# Patient Record
Sex: Female | Born: 1955 | Race: Black or African American | Hispanic: No | Marital: Married | Smoking: Never smoker
Health system: Southern US, Community
[De-identification: ages and names within clinical notes are randomized; demographics above are authoritative.]

## PROBLEM LIST (undated history)

## (undated) DIAGNOSIS — J45909 Unspecified asthma, uncomplicated: Secondary | ICD-10-CM

## (undated) DIAGNOSIS — J329 Chronic sinusitis, unspecified: Secondary | ICD-10-CM

## (undated) DIAGNOSIS — M722 Plantar fascial fibromatosis: Secondary | ICD-10-CM

## (undated) DIAGNOSIS — F419 Anxiety disorder, unspecified: Secondary | ICD-10-CM

## (undated) DIAGNOSIS — R0600 Dyspnea, unspecified: Secondary | ICD-10-CM

## (undated) DIAGNOSIS — I10 Essential (primary) hypertension: Secondary | ICD-10-CM

## (undated) DIAGNOSIS — M5416 Radiculopathy, lumbar region: Secondary | ICD-10-CM

## (undated) HISTORY — DX: Chronic sinusitis, unspecified: J32.9

## (undated) HISTORY — DX: Anxiety disorder, unspecified: F41.9

## (undated) HISTORY — DX: Dyspnea, unspecified: R06.00

## (undated) HISTORY — DX: Radiculopathy, lumbar region: M54.16

## (undated) HISTORY — DX: Plantar fascial fibromatosis: M72.2

---

## 1998-09-28 ENCOUNTER — Other Ambulatory Visit: Admission: RE | Admit: 1998-09-28 | Discharge: 1998-09-28 | Payer: Self-pay | Admitting: Obstetrics and Gynecology

## 1999-11-09 ENCOUNTER — Other Ambulatory Visit: Admission: RE | Admit: 1999-11-09 | Discharge: 1999-11-09 | Payer: Self-pay | Admitting: Obstetrics and Gynecology

## 2012-05-14 SURGERY — LIGATION, FALLOPIAN TUBE, POSTPARTUM
Anesthesia: Spinal | Laterality: Bilateral

## 2014-03-04 ENCOUNTER — Encounter (HOSPITAL_COMMUNITY): Payer: Self-pay | Admitting: *Deleted

## 2014-03-04 ENCOUNTER — Emergency Department (HOSPITAL_COMMUNITY)
Admission: EM | Admit: 2014-03-04 | Discharge: 2014-03-05 | Disposition: A | Discharge status: Home / Self Care | Payer: BC Managed Care – PPO | Attending: Emergency Medicine | Admitting: Emergency Medicine

## 2014-03-04 DIAGNOSIS — M5441 Lumbago with sciatica, right side: Secondary | ICD-10-CM | POA: Diagnosis not present

## 2014-03-04 DIAGNOSIS — I10 Essential (primary) hypertension: Secondary | ICD-10-CM | POA: Insufficient documentation

## 2014-03-04 DIAGNOSIS — M545 Low back pain: Secondary | ICD-10-CM | POA: Diagnosis present

## 2014-03-04 DIAGNOSIS — J45909 Unspecified asthma, uncomplicated: Secondary | ICD-10-CM | POA: Insufficient documentation

## 2014-03-04 HISTORY — DX: Unspecified asthma, uncomplicated: J45.909

## 2014-03-04 HISTORY — DX: Essential (primary) hypertension: I10

## 2014-03-04 NOTE — ED Notes (Signed)
Pt reports R back pain radiating down to her R leg.  Pt reports hx of sciatica.

## 2014-03-05 MED ORDER — HYDROMORPHONE HCL 1 MG/ML IJ SOLN
1.0000 mg | Freq: Once | INTRAMUSCULAR | Status: AC
  Administered 2014-03-05: 1 mg via INTRAMUSCULAR
  Filled 2014-03-05: qty 1

## 2014-03-05 MED ORDER — PREDNISONE 10 MG PO TABS: ORAL_TABLET | ORAL | Status: DC

## 2014-03-05 MED ORDER — CYCLOBENZAPRINE HCL 10 MG PO TABS: 10.0000 mg | ORAL_TABLET | Freq: Two times a day (BID) | ORAL | Status: AC | PRN

## 2014-03-05 MED ORDER — HYDROCODONE-ACETAMINOPHEN 5-325 MG PO TABS: 1.0000 | ORAL_TABLET | ORAL | Status: AC | PRN

## 2014-03-05 NOTE — ED Provider Notes (Signed)
CSN: [id number]     Arrival date & time 03/04/14  [id number] History   None    Chief Complaint  Patient presents with  . Back Pain     (Consider location/radiation/quality/duration/timing/severity/associated sxs/prior Treatment) Patient is a 58 y.o. female presenting with back pain. The history is provided by the patient. No language interpreter was used.  Back Pain Location:  Lumbar spine Quality:  Stabbing and shooting Radiates to:  R knee and R thigh Pain severity:  Moderate Associated symptoms: no fever, no numbness and no weakness   Associated symptoms comment:  Recurrent low back pain that started 2 days ago. Previously treated for sciatica and pain is currently radiating into the right thigh as it has in the past. No weakness or numbness. No incontinence issues.    Past Medical History  Diagnosis Date  . Hypertension   . Asthma    History reviewed. No pertinent past surgical history. No family history on file. History  Substance Use Topics  . Smoking status: Never Smoker   . Smokeless tobacco: Not on file  . Alcohol Use: No   OB History    No data available     Review of Systems  Constitutional: Negative for fever and chills.  Gastrointestinal: Negative.  Negative for nausea.  Genitourinary: Negative for enuresis.  Musculoskeletal: Positive for back pain.  Skin: Negative.   Neurological: Negative.  Negative for weakness and numbness.      Allergies  Review of patient's allergies indicates no known allergies.  Home Medications   Prior to Admission medications   Not on File   BP 194/78 mmHg  Pulse 70  Temp(Src) 97.6 F (36.4 C) (Oral)  Resp 16  SpO2 100% Physical Exam  Constitutional: She is oriented to person, place, and time. She appears well-developed and well-nourished.  HENT:  Head: Normocephalic.  Neck: Normal range of motion. Neck supple.  Cardiovascular: Normal rate and regular rhythm.   Pulmonary/Chest: Effort normal and breath sounds  normal.  Abdominal: Soft. Bowel sounds are normal. There is no tenderness. There is no rebound and no guarding.  Musculoskeletal: Normal range of motion.  Mild lumbar and paralumbar tenderness without swelling or discoloration.  Neurological: She is alert and oriented to person, place, and time. She has normal reflexes. Coordination normal.  Ambulatory without ataxia.   Skin: Skin is warm and dry. No rash noted.  Psychiatric: She has a normal mood and affect.    ED Course  Procedures (including critical care time) Labs Review Labs Reviewed - No data to display  Imaging Review No results found.   EKG Interpretation None      MDM   Final diagnoses:  None    1. Low back pain  Pain is recurrent. There are no neurologic deficits on exam. Pain relief offered along with ortho referral to fully evaluation recurrent pattern.    Arnoldo HookerShari A Marita Burnsed, PA-C 03/05/14 [phone]  Derwood KaplanAnkit Nanavati, MD 03/06/14 [phone]

## 2014-03-05 NOTE — Discharge Instructions (Signed)
Back Pain, Adult °Back pain is very common. The pain often gets better over time. The cause of back pain is usually not dangerous. Most people can learn to manage their back pain on their own.  °HOME CARE  °· Stay active. Start with short walks on flat ground if you can. Try to walk farther each day. °· Do not sit, drive, or stand in one place for more than 30 minutes. Do not stay in bed. °· Do not avoid exercise or work. Activity can help your back heal faster. °· Be careful when you bend or lift an object. Bend at your knees, keep the object close to you, and do not twist. °· Sleep on a firm mattress. Lie on your side, and bend your knees. If you lie on your back, put a pillow under your knees. °· Only take medicines as told by your doctor. °· Put ice on the injured area. °¨ Put ice in a plastic bag. °¨ Place a towel between your skin and the bag. °¨ Leave the ice on for 15-20 minutes, 03-04 times a day for the first 2 to 3 days. After that, you can switch between ice and heat packs. °· Ask your doctor about back exercises or massage. °· Avoid feeling anxious or stressed. Find good ways to deal with stress, such as exercise. °GET HELP RIGHT AWAY IF:  °· Your pain does not go away with rest or medicine. °· Your pain does not go away in 1 week. °· You have new problems. °· You do not feel well. °· The pain spreads into your legs. °· You cannot control when you poop (bowel movement) or pee (urinate). °· Your arms or legs feel weak or lose feeling (numbness). °· You feel sick to your stomach (nauseous) or throw up (vomit). °· You have belly (abdominal) pain. °· You feel like you may pass out (faint). °MAKE SURE YOU:  °· Understand these instructions. °· Will watch your condition. °· Will get help right away if you are not doing well or get worse. °Document Released: 08/21/2007 Document Revised: 05/27/2011 Document Reviewed: 07/06/2013 °[employer]® Patient Information ©2015 [employer]. This information is not intended  to replace advice given to you by your health care provider. Make sure you discuss any questions you have with your health care provider. ° °

## 2014-07-19 ENCOUNTER — Ambulatory Visit (INDEPENDENT_AMBULATORY_CARE_PROVIDER_SITE_OTHER): Payer: BC Managed Care – PPO | Admitting: Podiatry

## 2014-07-19 ENCOUNTER — Encounter: Payer: Self-pay | Admitting: Podiatry

## 2014-07-19 ENCOUNTER — Ambulatory Visit (INDEPENDENT_AMBULATORY_CARE_PROVIDER_SITE_OTHER): Payer: BC Managed Care – PPO

## 2014-07-19 VITALS — BP 141/70 | HR 64 | Resp 12

## 2014-07-19 DIAGNOSIS — M722 Plantar fascial fibromatosis: Secondary | ICD-10-CM

## 2014-07-19 DIAGNOSIS — M2041 Other hammer toe(s) (acquired), right foot: Secondary | ICD-10-CM

## 2014-07-19 DIAGNOSIS — M779 Enthesopathy, unspecified: Secondary | ICD-10-CM

## 2014-07-19 MED ORDER — TRIAMCINOLONE ACETONIDE 10 MG/ML IJ SUSP
10.0000 mg | Freq: Once | INTRAMUSCULAR | Status: AC
  Administered 2014-07-19: 10 mg

## 2014-07-19 NOTE — Patient Instructions (Signed)

## 2014-07-19 NOTE — Progress Notes (Signed)
   Subjective:    Patient ID: Autumn Adams, female    DOB: 02/26/1956, 59 y.o.   MRN: [id number]  HPI  PT STATED RT FOOT BOTTOM AND SIDE OF THE HEEL IS BEEN PAINFUL FOR 3 WEEKS. THE HEEL IS GETTING WORSE, ESPECIALLY WHEN WALKING OR FIRST STEP IN THE MORNING. TRIED ICE IT HELP SOME.  Review of Systems  Respiratory: Positive for shortness of breath.   Musculoskeletal: Positive for joint swelling and gait problem.       Objective:   Physical Exam        Assessment & Plan:

## 2014-07-19 NOTE — Progress Notes (Signed)
Subjective:     Patient ID: Autumn Adams, female   DOB: 10/23/1955, 59 y.o.   MRN: [id number]  HPI patient presents stating I'm having a lot of pain in my right heel around my right first metatarsal joint and I know I need to get this toe fixed but I was supposed to get fixed 2 years ago. Also my orthotics have lost their support and no longer affective for me   Review of Systems     Objective:   Physical Exam Neurovascular status is found to be intact with muscle strength adequate range of motion within normal limits. Patient's plantar heel right is quite sore when palpated with moderate depression of the arch and discomfort around the first MPJ right consistent with possible gout. It is inflamed with structural bunion deformity noted and I noted there to be painful keratotic lesion fifth toe right foot    Assessment:     Plantar fasciitis right with depression of the arch is, getting factor with capsulitis first MPJ right with possible gout-like symptoms and hammertoe deformity of long-term duration right fifth toe    Plan:     Reviewed all conditions and discussed treatment options. Mother focus on the heel and the capsule first and long-term we are to fix this toe which she wants to get fixed and approximate 1 month area I injected the right plantar fascia 3 Milligan Kenalog 5 g Xylocaine and dispensed fascial brace and injected around the first MPJ right 3 mg Kenalog 5 mg Xylocaine and discussed the hammertoe repair fifth toe right foot. Also she will bring orthotics in the next visit and we will evaluate for probable do orthotics and she is scheduled back in 1 week to see me

## 2014-07-26 ENCOUNTER — Ambulatory Visit: Payer: BC Managed Care – PPO | Admitting: Podiatry

## 2014-07-28 ENCOUNTER — Ambulatory Visit (INDEPENDENT_AMBULATORY_CARE_PROVIDER_SITE_OTHER): Payer: BC Managed Care – PPO | Admitting: Podiatry

## 2014-07-28 DIAGNOSIS — M722 Plantar fascial fibromatosis: Secondary | ICD-10-CM

## 2014-07-31 NOTE — Progress Notes (Signed)
Subjective:     Patient ID: Autumn Adams, female   DOB: 03/11/1956, 59 y.o.   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

## 2014-08-26 ENCOUNTER — Ambulatory Visit: Payer: BC Managed Care – PPO | Admitting: *Deleted

## 2014-08-26 DIAGNOSIS — M722 Plantar fascial fibromatosis: Secondary | ICD-10-CM

## 2014-08-26 NOTE — Patient Instructions (Signed)

## 2014-08-26 NOTE — Progress Notes (Signed)
Patient ID: Autumn Adams, female   DOB: [birth year], 59 y.o.   MRN: [id number] PICKING UP INSERTS. PLANTAR FASCIITIS IS FLARING.

## 2014-09-26 ENCOUNTER — Ambulatory Visit: Payer: BC Managed Care – PPO | Admitting: Podiatry

## 2015-08-10 DIAGNOSIS — H903 Sensorineural hearing loss, bilateral: Secondary | ICD-10-CM | POA: Insufficient documentation

## 2015-08-10 DIAGNOSIS — H6993 Unspecified Eustachian tube disorder, bilateral: Secondary | ICD-10-CM | POA: Insufficient documentation

## 2015-10-20 ENCOUNTER — Other Ambulatory Visit: Payer: Self-pay | Admitting: Otolaryngology

## 2015-10-20 DIAGNOSIS — H9192 Unspecified hearing loss, left ear: Secondary | ICD-10-CM

## 2015-11-02 ENCOUNTER — Other Ambulatory Visit: Payer: BC Managed Care – PPO

## 2017-06-23 ENCOUNTER — Ambulatory Visit
Admission: RE | Admit: 2017-06-23 | Discharge: 2017-06-23 | Disposition: A | Discharge status: Home / Self Care | Payer: BC Managed Care – PPO | Source: Ambulatory Visit | Attending: Internal Medicine | Admitting: Internal Medicine

## 2017-06-23 ENCOUNTER — Other Ambulatory Visit: Payer: Self-pay | Admitting: Internal Medicine

## 2017-06-23 DIAGNOSIS — M5416 Radiculopathy, lumbar region: Secondary | ICD-10-CM

## 2017-07-01 ENCOUNTER — Other Ambulatory Visit: Payer: Self-pay | Admitting: Internal Medicine

## 2017-07-01 DIAGNOSIS — M5416 Radiculopathy, lumbar region: Secondary | ICD-10-CM

## 2017-07-05 ENCOUNTER — Ambulatory Visit: Payer: BC Managed Care – PPO

## 2018-11-25 ENCOUNTER — Ambulatory Visit
Admission: RE | Admit: 2018-11-25 | Discharge: 2018-11-25 | Disposition: A | Discharge status: Home / Self Care | Payer: BC Managed Care – PPO | Source: Ambulatory Visit | Attending: Internal Medicine | Admitting: Internal Medicine

## 2018-11-25 ENCOUNTER — Other Ambulatory Visit: Payer: Self-pay | Admitting: Internal Medicine

## 2018-11-25 DIAGNOSIS — M5489 Other dorsalgia: Secondary | ICD-10-CM

## 2018-12-18 ENCOUNTER — Other Ambulatory Visit: Payer: Self-pay | Admitting: Internal Medicine

## 2018-12-18 DIAGNOSIS — M5416 Radiculopathy, lumbar region: Secondary | ICD-10-CM

## 2019-01-03 ENCOUNTER — Ambulatory Visit
Admission: RE | Admit: 2019-01-03 | Discharge: 2019-01-03 | Disposition: A | Discharge status: Home / Self Care | Payer: BC Managed Care – PPO | Source: Ambulatory Visit | Attending: Internal Medicine | Admitting: Internal Medicine

## 2019-01-03 ENCOUNTER — Other Ambulatory Visit: Payer: Self-pay

## 2019-01-03 DIAGNOSIS — M5416 Radiculopathy, lumbar region: Secondary | ICD-10-CM

## 2019-01-15 DIAGNOSIS — G8929 Other chronic pain: Secondary | ICD-10-CM | POA: Insufficient documentation

## 2019-10-21 DIAGNOSIS — M5431 Sciatica, right side: Secondary | ICD-10-CM | POA: Diagnosis not present

## 2019-11-10 DIAGNOSIS — Z1211 Encounter for screening for malignant neoplasm of colon: Secondary | ICD-10-CM | POA: Diagnosis not present

## 2019-11-10 DIAGNOSIS — K5730 Diverticulosis of large intestine without perforation or abscess without bleeding: Secondary | ICD-10-CM | POA: Diagnosis not present

## 2019-11-10 DIAGNOSIS — K648 Other hemorrhoids: Secondary | ICD-10-CM | POA: Diagnosis not present

## 2019-11-10 DIAGNOSIS — Q438 Other specified congenital malformations of intestine: Secondary | ICD-10-CM | POA: Diagnosis not present

## 2019-11-12 DIAGNOSIS — L03011 Cellulitis of right finger: Secondary | ICD-10-CM | POA: Diagnosis not present

## 2019-12-12 DIAGNOSIS — Z20828 Contact with and (suspected) exposure to other viral communicable diseases: Secondary | ICD-10-CM | POA: Diagnosis not present

## 2019-12-16 DIAGNOSIS — E7800 Pure hypercholesterolemia, unspecified: Secondary | ICD-10-CM | POA: Diagnosis not present

## 2019-12-16 DIAGNOSIS — M109 Gout, unspecified: Secondary | ICD-10-CM | POA: Diagnosis not present

## 2019-12-16 DIAGNOSIS — L900 Lichen sclerosus et atrophicus: Secondary | ICD-10-CM | POA: Diagnosis not present

## 2019-12-16 DIAGNOSIS — M5431 Sciatica, right side: Secondary | ICD-10-CM | POA: Diagnosis not present

## 2019-12-16 DIAGNOSIS — J309 Allergic rhinitis, unspecified: Secondary | ICD-10-CM | POA: Diagnosis not present

## 2019-12-16 DIAGNOSIS — E559 Vitamin D deficiency, unspecified: Secondary | ICD-10-CM | POA: Diagnosis not present

## 2019-12-16 DIAGNOSIS — I10 Essential (primary) hypertension: Secondary | ICD-10-CM | POA: Diagnosis not present

## 2019-12-16 DIAGNOSIS — Z23 Encounter for immunization: Secondary | ICD-10-CM | POA: Diagnosis not present

## 2019-12-16 DIAGNOSIS — H269 Unspecified cataract: Secondary | ICD-10-CM | POA: Diagnosis not present

## 2019-12-16 DIAGNOSIS — K5730 Diverticulosis of large intestine without perforation or abscess without bleeding: Secondary | ICD-10-CM | POA: Diagnosis not present

## 2019-12-16 DIAGNOSIS — Z1389 Encounter for screening for other disorder: Secondary | ICD-10-CM | POA: Diagnosis not present

## 2019-12-16 DIAGNOSIS — Z0000 Encounter for general adult medical examination without abnormal findings: Secondary | ICD-10-CM | POA: Diagnosis not present

## 2020-01-17 DIAGNOSIS — R202 Paresthesia of skin: Secondary | ICD-10-CM | POA: Diagnosis not present

## 2020-01-17 DIAGNOSIS — I10 Essential (primary) hypertension: Secondary | ICD-10-CM | POA: Diagnosis not present

## 2020-01-19 DIAGNOSIS — H04123 Dry eye syndrome of bilateral lacrimal glands: Secondary | ICD-10-CM | POA: Diagnosis not present

## 2020-01-19 DIAGNOSIS — H43812 Vitreous degeneration, left eye: Secondary | ICD-10-CM | POA: Diagnosis not present

## 2020-01-19 DIAGNOSIS — H25013 Cortical age-related cataract, bilateral: Secondary | ICD-10-CM | POA: Diagnosis not present

## 2020-02-15 DIAGNOSIS — Z1231 Encounter for screening mammogram for malignant neoplasm of breast: Secondary | ICD-10-CM | POA: Diagnosis not present

## 2020-02-28 DIAGNOSIS — L900 Lichen sclerosus et atrophicus: Secondary | ICD-10-CM | POA: Diagnosis not present

## 2020-03-13 DIAGNOSIS — L439 Lichen planus, unspecified: Secondary | ICD-10-CM | POA: Diagnosis not present

## 2020-04-10 DIAGNOSIS — L089 Local infection of the skin and subcutaneous tissue, unspecified: Secondary | ICD-10-CM | POA: Diagnosis not present

## 2020-04-10 DIAGNOSIS — D485 Neoplasm of uncertain behavior of skin: Secondary | ICD-10-CM | POA: Diagnosis not present

## 2020-04-10 DIAGNOSIS — L988 Other specified disorders of the skin and subcutaneous tissue: Secondary | ICD-10-CM | POA: Diagnosis not present

## 2020-05-11 DIAGNOSIS — L309 Dermatitis, unspecified: Secondary | ICD-10-CM | POA: Diagnosis not present

## 2020-06-07 DIAGNOSIS — H25013 Cortical age-related cataract, bilateral: Secondary | ICD-10-CM | POA: Diagnosis not present

## 2020-06-07 DIAGNOSIS — H04123 Dry eye syndrome of bilateral lacrimal glands: Secondary | ICD-10-CM | POA: Diagnosis not present

## 2020-06-07 DIAGNOSIS — H43813 Vitreous degeneration, bilateral: Secondary | ICD-10-CM | POA: Diagnosis not present

## 2020-07-10 DIAGNOSIS — L439 Lichen planus, unspecified: Secondary | ICD-10-CM | POA: Diagnosis not present

## 2020-07-10 DIAGNOSIS — L309 Dermatitis, unspecified: Secondary | ICD-10-CM | POA: Diagnosis not present

## 2020-07-13 DIAGNOSIS — H268 Other specified cataract: Secondary | ICD-10-CM | POA: Diagnosis not present

## 2020-07-13 DIAGNOSIS — H25011 Cortical age-related cataract, right eye: Secondary | ICD-10-CM | POA: Diagnosis not present

## 2020-09-21 DIAGNOSIS — L309 Dermatitis, unspecified: Secondary | ICD-10-CM | POA: Diagnosis not present

## 2020-09-21 DIAGNOSIS — L439 Lichen planus, unspecified: Secondary | ICD-10-CM | POA: Diagnosis not present

## 2020-11-03 DIAGNOSIS — L989 Disorder of the skin and subcutaneous tissue, unspecified: Secondary | ICD-10-CM | POA: Diagnosis not present

## 2020-11-03 DIAGNOSIS — L309 Dermatitis, unspecified: Secondary | ICD-10-CM | POA: Diagnosis not present

## 2020-11-03 DIAGNOSIS — L308 Other specified dermatitis: Secondary | ICD-10-CM | POA: Diagnosis not present

## 2020-11-03 DIAGNOSIS — L439 Lichen planus, unspecified: Secondary | ICD-10-CM | POA: Diagnosis not present

## 2020-11-07 DIAGNOSIS — M5441 Lumbago with sciatica, right side: Secondary | ICD-10-CM | POA: Diagnosis not present

## 2020-11-07 DIAGNOSIS — I10 Essential (primary) hypertension: Secondary | ICD-10-CM | POA: Diagnosis not present

## 2020-11-07 DIAGNOSIS — Z6837 Body mass index (BMI) 37.0-37.9, adult: Secondary | ICD-10-CM | POA: Diagnosis not present

## 2020-11-17 DIAGNOSIS — M5416 Radiculopathy, lumbar region: Secondary | ICD-10-CM | POA: Diagnosis not present

## 2020-11-17 DIAGNOSIS — R1031 Right lower quadrant pain: Secondary | ICD-10-CM | POA: Diagnosis not present

## 2020-12-26 ENCOUNTER — Ambulatory Visit
Admission: EM | Admit: 2020-12-26 | Discharge: 2020-12-26 | Disposition: A | Discharge status: Home / Self Care | Payer: Medicare PPO

## 2020-12-26 DIAGNOSIS — T148XXA Other injury of unspecified body region, initial encounter: Secondary | ICD-10-CM

## 2020-12-26 MED ORDER — METHYLPREDNISOLONE SODIUM SUCC 125 MG IJ SOLR
80.0000 mg | Freq: Once | INTRAMUSCULAR | Status: AC
  Administered 2020-12-26: 80 mg via INTRAMUSCULAR

## 2020-12-26 NOTE — ED Provider Notes (Signed)
[facility]    CSN: [id number] Arrival date & time: 12/26/20  1619      History   Chief Complaint Chief Complaint  Patient presents with   Flank Pain    left    HPI Autumn Adams is a 65 y.o. female.   Patient here today for evaluation of left-sided flank pain that started last night.  She reports she had difficulty sleeping because any movement would cause worsening pain.  She has had similar symptoms in her back but not in this area.  She has not had any numbness or tingling.  She has tried taking her medications at home including Robaxin without significant relief.  She denies any chest pain or shortness of breath.  She denied any lightheadedness.  The history is provided by the patient.  Flank Pain Pertinent negatives include no chest pain and no shortness of breath.   Past Medical History:  Diagnosis Date   Asthma    Hypertension     There are no problems to display for this patient.   History reviewed. No pertinent surgical history.  OB History   No obstetric history on file.      Home Medications    Prior to Admission medications   Medication Sig Start Date End Date Taking? Authorizing Provider  allopurinol (ZYLOPRIM) 100 MG tablet Take 100 mg by mouth daily. 05/31/14   [provider]  amLODipine-benazepril (LOTREL) 10-20 MG per capsule Take 1 capsule by mouth daily. 02/26/14   [provider]  amoxicillin-clavulanate (AUGMENTIN) 500-125 MG per tablet  07/05/14   [provider]  cloNIDine (CATAPRES) 0.1 MG tablet Take 0.1 mg by mouth at bedtime. 10/06/20   [provider]  cyclobenzaprine (FLEXERIL) 10 MG tablet Take 1 tablet (10 mg total) by mouth 2 (two) times daily as needed for muscle spasms. 03/05/14   Elpidio Anis, PA-C  HYDROcodone-acetaminophen (NORCO/VICODIN) 5-325 MG per tablet Take 1-2 tablets by mouth every 4 (four) hours as needed. 03/05/14   Elpidio Anis, PA-C  indomethacin (INDOCIN) 25  MG capsule  07/04/14   [provider]  methocarbamol (ROBAXIN) 500 MG tablet Take 500 mg by mouth every 6 (six) hours as needed. 11/07/20   [provider]  metoprolol succinate (TOPROL-XL) 50 MG 24 hr tablet Take 50 mg by mouth daily. 01/26/14   [provider]  metoprolol tartrate (LOPRESSOR) 25 MG tablet Take 25 mg by mouth 2 (two) times daily. 11/14/20   [provider]  naproxen sodium (ANAPROX) 220 MG tablet Take 440 mg by mouth 2 (two) times daily as needed (pain).    [provider]  predniSONE (DELTASONE) 10 MG tablet Take 6 tablets day 1 Take 5 tablets day 2 Take 4 tablets day 3 Take 3 tablets day 4  Take 2 tablets day 5 Take 1 tablet day 6 03/05/14   Upstill, Melvenia Beam, PA-C  predniSONE (STERAPRED UNI-PAK) 5 MG TABS tablet  05/31/14   [provider]  probenecid (BENEMID) 500 MG tablet Take 250 mg by mouth 2 (two) times daily. 02/26/14   [provider]  traMADol (ULTRAM) 50 MG tablet Take 50-100 mg by mouth every 6 (six) hours as needed. 11/07/20   [provider]  VENTOLIN HFA 108 (90 BASE) MCG/ACT inhaler Inhale 2 puffs into the lungs every 4 (four) hours as needed for wheezing or shortness of breath.  01/19/14   [provider]    Family History History reviewed. No pertinent family history.  Social History Social History   Tobacco Use   Smoking status: Never   Smokeless tobacco: Never  Substance Use Topics   Alcohol use: No   Drug use: No     Allergies   Patient has no known allergies.   Review of Systems Review of Systems  Constitutional:  Negative for chills and fever.  Eyes:  Negative for discharge and redness.  Respiratory:  Negative for cough and shortness of breath.   Cardiovascular:  Negative for chest pain.  Gastrointestinal:  Negative for nausea and vomiting.  Genitourinary:  Positive for flank pain.  Musculoskeletal:  Positive for back pain.    Physical Exam Triage Vital  Signs ED Triage Vitals  Enc Vitals Group     BP 12/26/20 1648 (!) 178/103     Pulse Rate 12/26/20 1648 (!) 101     Resp 12/26/20 1648 18     Temp 12/26/20 1648 98.6 F (37 C)     Temp Source 12/26/20 1648 Oral     SpO2 12/26/20 1648 95 %     Weight --      Height --      Head Circumference --      Peak Flow --      Pain Score 12/26/20 1651 9     Pain Loc --      Pain Edu? --      Excl. in GC? --    No data found.  Updated Vital Signs BP (!) 178/103 (BP Location: Left Arm) Comment: Pt took 2/3 BP meds today  Pulse (!) 101   Temp 98.6 F (37 C) (Oral)   Resp 18   SpO2 95%      Physical Exam Vitals and nursing note reviewed.  Constitutional:      General: She is not in acute distress.    Appearance: Normal appearance. She is not ill-appearing.  HENT:     Head: Normocephalic and atraumatic.  Eyes:     Conjunctiva/sclera: Conjunctivae normal.  Cardiovascular:     Rate and Rhythm: Normal rate.  Pulmonary:     Effort: Pulmonary effort is normal.  Musculoskeletal:     Comments: TTP noted around left flank with muscle spasm appreciated  Skin:    General: Skin is warm and dry.  Neurological:     Mental Status: She is alert.  Psychiatric:        Mood and Affect: Mood normal.        Behavior: Behavior normal.     UC Treatments / Results  Labs (all labs ordered are listed, but only abnormal results are displayed) Labs Reviewed - No data to display  EKG   Radiology No results found.  Procedures Procedures (including critical care time)  Medications Ordered in UC Medications  methylPREDNISolone sodium succinate (SOLU-MEDROL) 125 mg/2 mL injection 80 mg (80 mg Intramuscular Given 12/26/20 1711)    Initial Impression / Assessment and Plan / UC Course  I have reviewed the triage vital signs and the nursing notes.  Pertinent labs & imaging results that were available during my care of the patient were reviewed by me and considered in my medical decision  making (see chart for details).  Suspect likely muscular strain, and offered steroid treatment for same.  Patient prefers injection as she has had this in the past and it has helped.  Solu-Medrol injection administered in office.  Recommended follow-up if symptoms fail to improve or worsen anyway.  Final Clinical Impressions(s) / UC Diagnoses  Final diagnoses:  Muscle strain     Discharge Instructions      Take muscle relaxer if needed for spasms. Follow up with any further concerns.      ED Prescriptions   None    PDMP not reviewed this encounter.   Tomi Bamberger, PA-C 12/26/20 1722

## 2020-12-26 NOTE — ED Triage Notes (Signed)
Onset yesterday of left sided flank pain that has worsened since the onset. Pt describes the pain as a "catch". Notes h/o back pain. Pain is interfering is with her sleep. Has been taking tramadol and robaxin w/o relief. Denies BLE pain and n/t. Denies urinary sxs.

## 2020-12-26 NOTE — Discharge Instructions (Signed)
Take muscle relaxer if needed for spasms. Follow up with any further concerns.

## 2020-12-27 DIAGNOSIS — I10 Essential (primary) hypertension: Secondary | ICD-10-CM | POA: Diagnosis not present

## 2020-12-27 DIAGNOSIS — S335XXA Sprain of ligaments of lumbar spine, initial encounter: Secondary | ICD-10-CM | POA: Diagnosis not present

## 2020-12-28 DIAGNOSIS — Z7722 Contact with and (suspected) exposure to environmental tobacco smoke (acute) (chronic): Secondary | ICD-10-CM | POA: Diagnosis not present

## 2020-12-28 DIAGNOSIS — Z818 Family history of other mental and behavioral disorders: Secondary | ICD-10-CM | POA: Diagnosis not present

## 2020-12-28 DIAGNOSIS — I739 Peripheral vascular disease, unspecified: Secondary | ICD-10-CM | POA: Diagnosis not present

## 2020-12-28 DIAGNOSIS — E785 Hyperlipidemia, unspecified: Secondary | ICD-10-CM | POA: Diagnosis not present

## 2020-12-28 DIAGNOSIS — Z79891 Long term (current) use of opiate analgesic: Secondary | ICD-10-CM | POA: Diagnosis not present

## 2020-12-28 DIAGNOSIS — L309 Dermatitis, unspecified: Secondary | ICD-10-CM | POA: Diagnosis not present

## 2020-12-28 DIAGNOSIS — Z803 Family history of malignant neoplasm of breast: Secondary | ICD-10-CM | POA: Diagnosis not present

## 2020-12-28 DIAGNOSIS — Z6836 Body mass index (BMI) 36.0-36.9, adult: Secondary | ICD-10-CM | POA: Diagnosis not present

## 2020-12-28 DIAGNOSIS — G8929 Other chronic pain: Secondary | ICD-10-CM | POA: Diagnosis not present

## 2020-12-28 DIAGNOSIS — I10 Essential (primary) hypertension: Secondary | ICD-10-CM | POA: Diagnosis not present

## 2020-12-28 DIAGNOSIS — Z791 Long term (current) use of non-steroidal anti-inflammatories (NSAID): Secondary | ICD-10-CM | POA: Diagnosis not present

## 2020-12-28 DIAGNOSIS — K08409 Partial loss of teeth, unspecified cause, unspecified class: Secondary | ICD-10-CM | POA: Diagnosis not present

## 2020-12-28 DIAGNOSIS — M792 Neuralgia and neuritis, unspecified: Secondary | ICD-10-CM | POA: Diagnosis not present

## 2020-12-28 DIAGNOSIS — Z823 Family history of stroke: Secondary | ICD-10-CM | POA: Diagnosis not present

## 2020-12-28 DIAGNOSIS — J45909 Unspecified asthma, uncomplicated: Secondary | ICD-10-CM | POA: Diagnosis not present

## 2020-12-28 DIAGNOSIS — Z811 Family history of alcohol abuse and dependence: Secondary | ICD-10-CM | POA: Diagnosis not present

## 2020-12-28 DIAGNOSIS — F439 Reaction to severe stress, unspecified: Secondary | ICD-10-CM | POA: Diagnosis not present

## 2021-01-15 DIAGNOSIS — M4316 Spondylolisthesis, lumbar region: Secondary | ICD-10-CM | POA: Diagnosis not present

## 2021-01-15 DIAGNOSIS — I10 Essential (primary) hypertension: Secondary | ICD-10-CM | POA: Diagnosis not present

## 2021-01-15 DIAGNOSIS — Z6836 Body mass index (BMI) 36.0-36.9, adult: Secondary | ICD-10-CM | POA: Diagnosis not present

## 2021-01-15 DIAGNOSIS — M5416 Radiculopathy, lumbar region: Secondary | ICD-10-CM | POA: Diagnosis not present

## 2021-02-02 DIAGNOSIS — I10 Essential (primary) hypertension: Secondary | ICD-10-CM | POA: Diagnosis not present

## 2021-02-02 DIAGNOSIS — S335XXA Sprain of ligaments of lumbar spine, initial encounter: Secondary | ICD-10-CM | POA: Diagnosis not present

## 2021-02-20 DIAGNOSIS — Z1231 Encounter for screening mammogram for malignant neoplasm of breast: Secondary | ICD-10-CM | POA: Diagnosis not present

## 2021-02-21 DIAGNOSIS — I10 Essential (primary) hypertension: Secondary | ICD-10-CM | POA: Diagnosis not present

## 2021-02-21 DIAGNOSIS — M7582 Other shoulder lesions, left shoulder: Secondary | ICD-10-CM | POA: Diagnosis not present

## 2021-04-05 DIAGNOSIS — J309 Allergic rhinitis, unspecified: Secondary | ICD-10-CM | POA: Diagnosis not present

## 2021-04-05 DIAGNOSIS — J4520 Mild intermittent asthma, uncomplicated: Secondary | ICD-10-CM | POA: Diagnosis not present

## 2021-04-05 DIAGNOSIS — K5730 Diverticulosis of large intestine without perforation or abscess without bleeding: Secondary | ICD-10-CM | POA: Diagnosis not present

## 2021-04-05 DIAGNOSIS — L900 Lichen sclerosus et atrophicus: Secondary | ICD-10-CM | POA: Diagnosis not present

## 2021-04-05 DIAGNOSIS — Z1211 Encounter for screening for malignant neoplasm of colon: Secondary | ICD-10-CM | POA: Diagnosis not present

## 2021-04-05 DIAGNOSIS — I10 Essential (primary) hypertension: Secondary | ICD-10-CM | POA: Diagnosis not present

## 2021-04-05 DIAGNOSIS — R202 Paresthesia of skin: Secondary | ICD-10-CM | POA: Diagnosis not present

## 2021-04-05 DIAGNOSIS — E559 Vitamin D deficiency, unspecified: Secondary | ICD-10-CM | POA: Diagnosis not present

## 2021-04-05 DIAGNOSIS — Z1389 Encounter for screening for other disorder: Secondary | ICD-10-CM | POA: Diagnosis not present

## 2021-04-05 DIAGNOSIS — Z23 Encounter for immunization: Secondary | ICD-10-CM | POA: Diagnosis not present

## 2021-04-05 DIAGNOSIS — E7800 Pure hypercholesterolemia, unspecified: Secondary | ICD-10-CM | POA: Diagnosis not present

## 2021-04-05 DIAGNOSIS — M109 Gout, unspecified: Secondary | ICD-10-CM | POA: Diagnosis not present

## 2021-04-05 DIAGNOSIS — Z0000 Encounter for general adult medical examination without abnormal findings: Secondary | ICD-10-CM | POA: Diagnosis not present

## 2021-04-09 IMAGING — MR MR LUMBAR SPINE W/O CM
4 of 5 series · 23 of 48 positions shown · non-contrast
Comparison: Prior radiograph from 11/25/2018.

CLINICAL DATA: Initial evaluation for low back pain extending to
waist, pain in right hip and thigh.

EXAM:
MRI LUMBAR SPINE WITHOUT CONTRAST
TECHNIQUE: Multiplanar, multisequence MR imaging of the lumbar spine was
performed. No intravenous contrast was administered.

[Series 2: T2 · sagittal · 4.0mm · 0.55mm/px · 6 of 16 slices shown (1 of 2)]
[im 1/16]
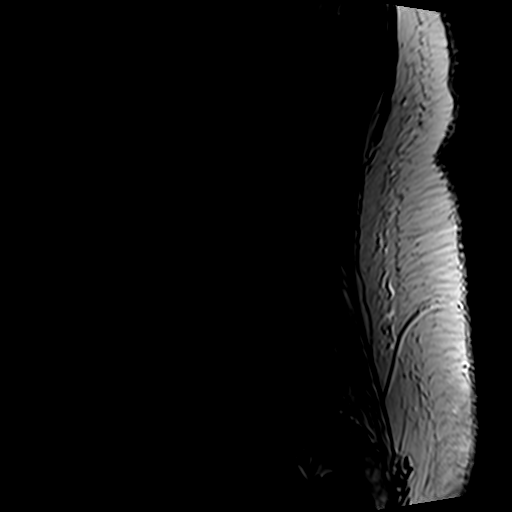
[im 4/16]
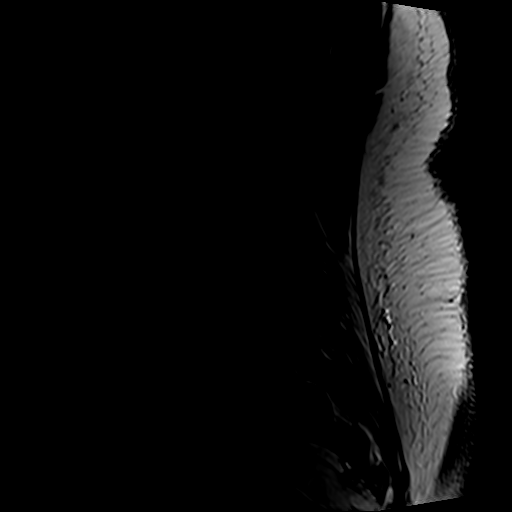
[im 7/16]
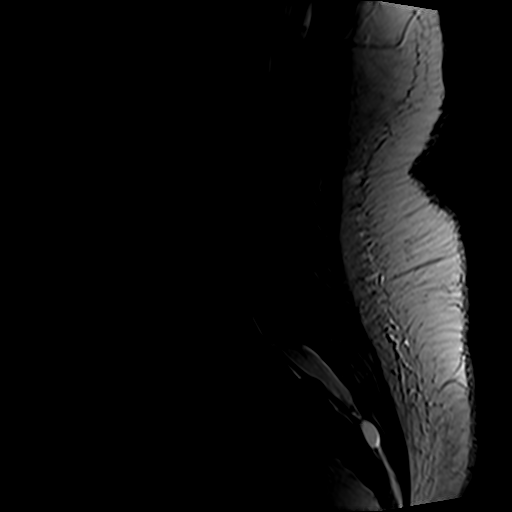
[im 10/16]
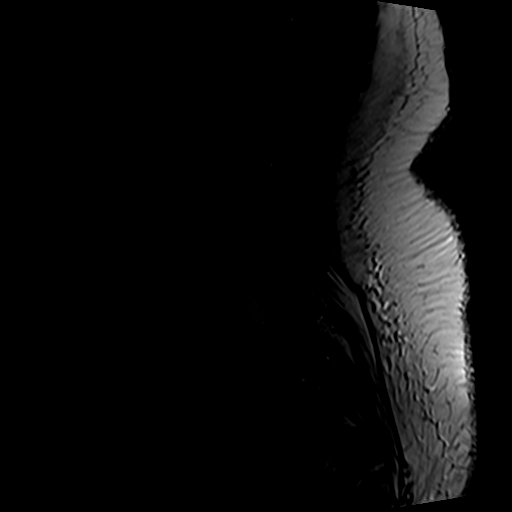
[im 13/16]
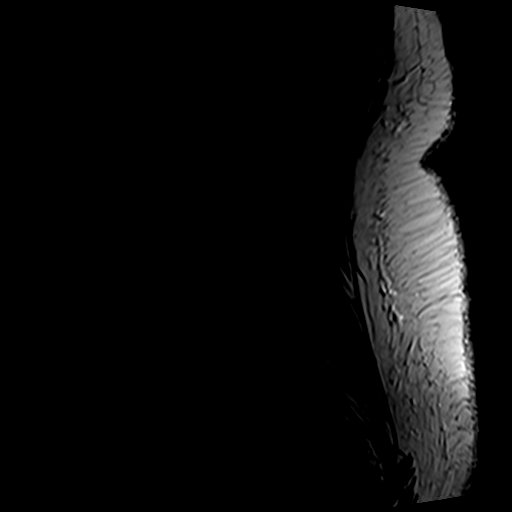
[im 16/16]
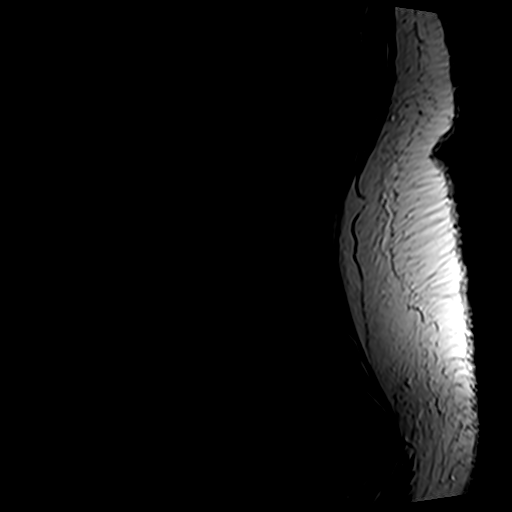

[Series 4: T1 · sagittal · 4.0mm · 0.55mm/px · 5 of 16 slices shown (1 of 2)]
[im 1/16]
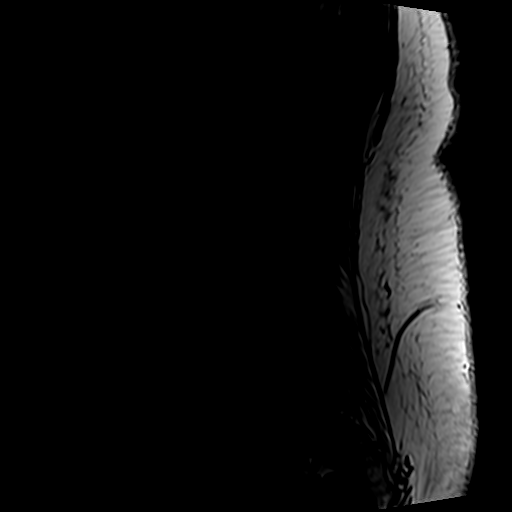
[im 4/16]
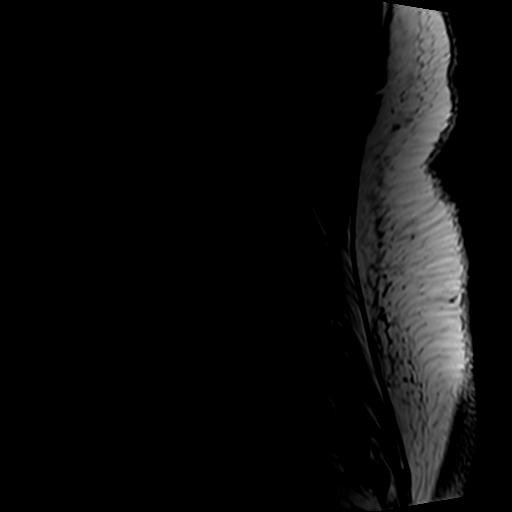
[im 7/16]
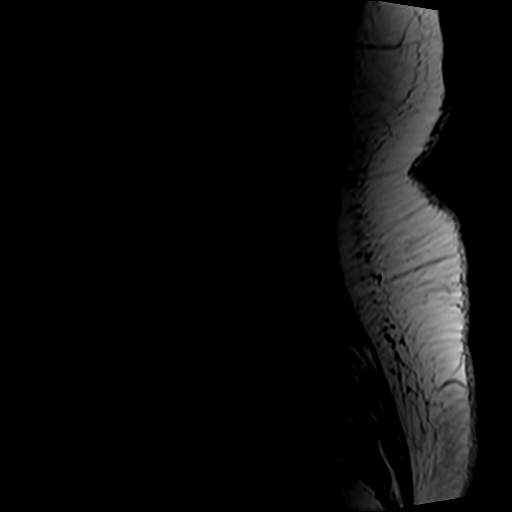
[im 10/16]
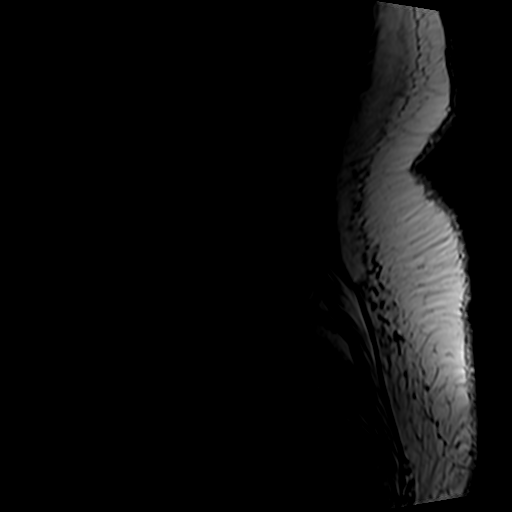
[im 16/16]
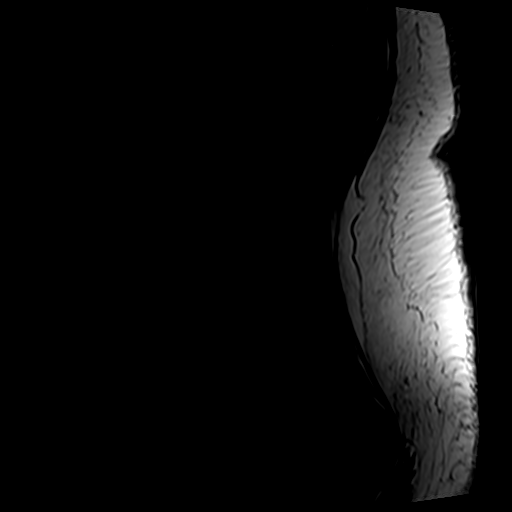

[Series 5: T2 · axial · 4.0mm · 0.70mm/px · z∈[-88,+119]mm · 9 of 37 slices shown (2 of 2)]
[im 1/37]
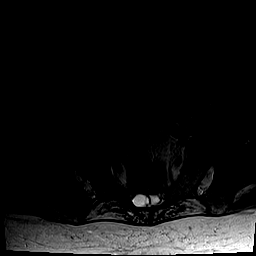
[im 6/37]
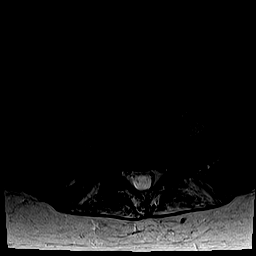
[im 11/37]
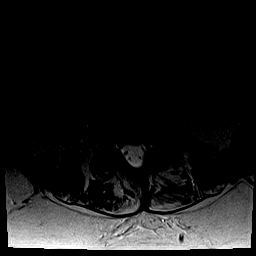
[im 16/37]
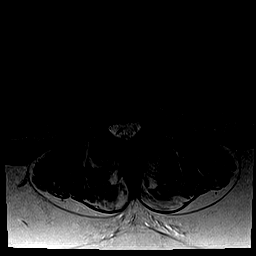
[im 19/37]
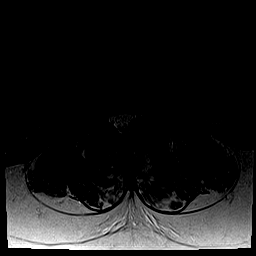
[im 21/37]
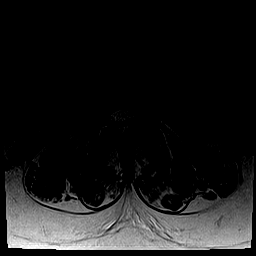
[im 26/37]
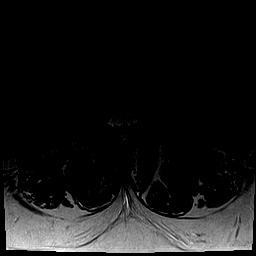
[im 31/37]
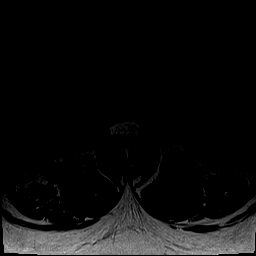
[im 37/37]
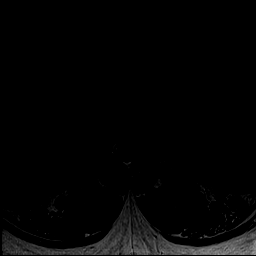

[Series 6: T1 · axial · 4.0mm · 0.35mm/px · z∈[-62,+88]mm · 3 of 37 slices shown (2 of 2)]
[im 6/37]
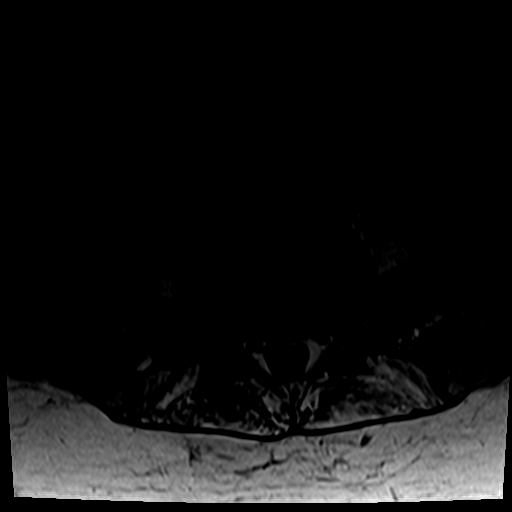
[im 19/37]
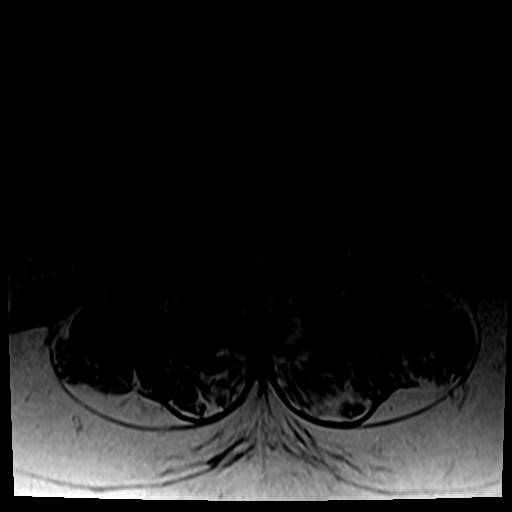
[im 31/37]
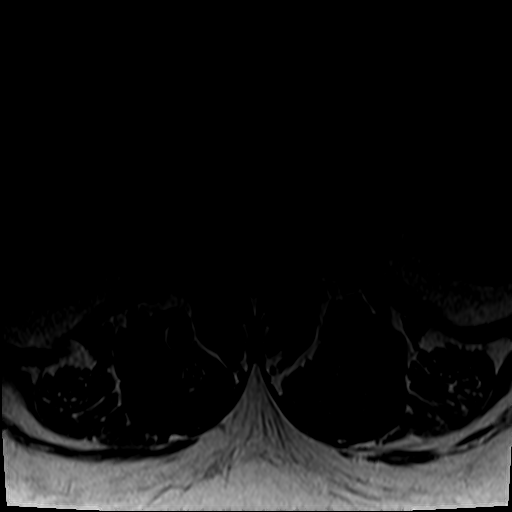

[23 of 48 positions shown; findings below may reference images not displayed]

FINDINGS: Segmentation: Standard. Lowest well-formed disc labeled the L5-S1
level.

Alignment: 3-4 mm anterolisthesis of L3 on L4, L4 on L5, and L5 on
S1, chronic and facet mediated. Mild exaggeration of the normal
lumbar lordosis.

Vertebrae: Vertebral body height maintained without evidence for
acute or chronic fracture. Bone marrow signal intensity within
normal limits. No discrete or worrisome osseous lesions. Reactive
endplate changes noted about the L5-S1 interspace. No other abnormal
marrow edema.

Conus medullaris and cauda equina: Conus extends to the L1 level.
Conus and cauda equina appear normal

Paraspinal and other soft tissues: Paraspinous soft tissues within
normal limits. Partially visualized visceral structures are
unremarkable.

Disc levels:

T10-11: Seen only on sagittal projection. Mild diffuse disc bulge
with disc desiccation. No significant stenosis.

T11-12: Unremarkable.

T12-L1: Mild diffuse disc bulge with disc desiccation. Superimposed
right subarticular disc extrusion with slight superior and inferior
migration of disc material (series 2, image 8). Slight lateral
extension towards the right neural foramen. Superimposed mild facet
hypertrophy. Resultant mild to moderate right lateral recess
stenosis, potentially affecting the right L1 nerve root. Mild to
moderate right foraminal narrowing. Central canal remains patent.

L1-2:  Unremarkable.

L2-3: Mild disc bulge with disc desiccation. Disc bulging asymmetric
to the right with superimposed right foraminal/extraforaminal disc
protrusion, closely approximating and potentially affecting the
exiting right L2 nerve root (series 5, image 16). Moderate facet and
ligament flavum hypertrophy. Trace joint effusion on the right. No
significant canal or lateral recess stenosis. Mild right worse than
left foraminal stenosis.

L3-4: Anterolisthesis. Mild diffuse disc bulge with disc desiccation
and intervertebral disc space narrowing. Moderate facet and ligament
flavum hypertrophy. Resultant mild left greater than right lateral
recess stenosis. Central canal remains patent. Mild to moderate
bilateral L3 foraminal narrowing.

L4-5: Anterolisthesis. Disc desiccation with mild annular disc
bulge. Moderate bilateral facet hypertrophy. Resultant mild left
lateral recess stenosis. No significant narrowing of the central
canal. Mild bilateral L4 foraminal stenosis.

L5-S1: Anterolisthesis. Diffuse disc bulge with disc desiccation and
intervertebral disc space narrowing. Mild reactive endplate changes.
Mild-to-moderate bilateral facet hypertrophy. No significant canal
or lateral recess stenosis. Moderate right L5 foraminal narrowing.
IMPRESSION: 1. Small right subarticular disc extrusion at T12-L1, potentially
affecting the descending right L1 nerve root.
2. Small right foraminal/extraforaminal disc protrusion at L2-3,
closely approximating and potentially affecting the exiting right L2
nerve root.
3. Moderate right L5 foraminal stenosis related to disc bulge,
reactive endplate changes, and facet hypertrophy.
4. Chronic grade 1 anterolisthesis of L3 on L4 through L5 on S1 with
associated moderate to advanced facet hypertrophy, which could
contribute to underlying back pain.

## 2021-06-25 ENCOUNTER — Ambulatory Visit (INDEPENDENT_AMBULATORY_CARE_PROVIDER_SITE_OTHER): Payer: Medicare HMO

## 2021-06-25 ENCOUNTER — Ambulatory Visit: Payer: Medicare HMO | Admitting: Podiatry

## 2021-06-25 DIAGNOSIS — L84 Corns and callosities: Secondary | ICD-10-CM

## 2021-06-25 DIAGNOSIS — M2041 Other hammer toe(s) (acquired), right foot: Secondary | ICD-10-CM | POA: Diagnosis not present

## 2021-06-25 DIAGNOSIS — M722 Plantar fascial fibromatosis: Secondary | ICD-10-CM | POA: Diagnosis not present

## 2021-06-25 MED ORDER — TRIAMCINOLONE ACETONIDE 10 MG/ML IJ SUSP: 10.0000 mg | Freq: Once | INTRAMUSCULAR | Status: AC

## 2021-06-25 MED ORDER — TRIAMCINOLONE ACETONIDE 10 MG/ML IJ SUSP
10.0000 mg | Freq: Once | INTRAMUSCULAR | Status: AC
  Administered 2021-06-25: 10 mg

## 2021-06-25 NOTE — Patient Instructions (Signed)

## 2021-06-26 NOTE — Progress Notes (Signed)
Subjective:  ? ?Patient ID: Autumn Adams, female   DOB: 66 y.o.   MRN: [id number]  ? ?HPI ?Patient presents with painful left heel its been going on for a while and states that she has dealt with it on and off but its been worse over the last 6 months and has a painful lesion right fifth toe that aches and causes her difficulty with wearing shoe gear.  Patient does not smoke likes to be active ? ? ?Review of Systems  ?All other systems reviewed and are negative. ? ? ?   ?Objective:  ?Physical Exam ?Vitals and nursing note reviewed.  ?Constitutional:   ?   Appearance: She is well-developed.  ?Pulmonary:  ?   Effort: Pulmonary effort is normal.  ?Musculoskeletal:     ?   General: Normal range of motion.  ?Skin: ?   General: Skin is warm.  ?Neurological:  ?   Mental Status: She is alert.  ?  ?Neurovascular status found to be intact muscle strength found to be adequate range of motion adequate.  Patient is noted to have acute discomfort plantar aspect left heel at the insertional point of the tendon into the calcaneus with inflammation fluid buildup and is noted to have keratotic lesion digit 5 right painful when pressed making shoe gear difficult.  Good digital perfusion well oriented x3 ? ?   ?Assessment:  ?Acute plantar fasciitis left with inflammation fluid buildup along with keratotic lesion digit 5 right painful when pressed ? ?   ?Plan:  ?H&P all conditions discussed and x-rays reviewed.  Today I went ahead and I did sterile prep injected the fascia left 3 mg Kenalog 5 mg Xylocaine at insertion and applied fascial brace to lift up the arch with instructions for shoe gear modifications oral anti-inflammatories.  Debrided lesion fifth digit right discussed arthroplasty that she wants done but I want to make sure the heel is better first ? ?X-rays indicate small spur plantar heel does not indicate stress fracture and rotation fifth digit right foot ?   ? ? ?

## 2021-07-27 DIAGNOSIS — M5416 Radiculopathy, lumbar region: Secondary | ICD-10-CM | POA: Diagnosis not present

## 2021-09-05 ENCOUNTER — Encounter: Payer: Self-pay | Admitting: Podiatry

## 2021-09-05 ENCOUNTER — Ambulatory Visit: Payer: Medicare HMO | Admitting: Podiatry

## 2021-09-05 DIAGNOSIS — M722 Plantar fascial fibromatosis: Secondary | ICD-10-CM | POA: Diagnosis not present

## 2021-09-05 MED ORDER — TRIAMCINOLONE ACETONIDE 10 MG/ML IJ SUSP
10.0000 mg | Freq: Once | INTRAMUSCULAR | Status: AC
  Administered 2021-09-05: 10 mg

## 2021-09-05 NOTE — Progress Notes (Signed)
Subjective:   Patient ID: Autumn Adams, female   DOB: 66 y.o.   MRN: [id number]   HPI Patient states that the heel pain left has come back and is now more constant but it is worse when she gets up in the morning and after periods of sitting   ROS      Objective:  Physical Exam  Neurovascular status intact exquisite discomfort plantar aspect left heel at the insertional point tendon calcaneus worse when she gets up in the morning after periods of sitting     Assessment:  Acute Planter fasciitis left that is reoccurring and related to periods of inactivity followed by weightbearing     Plan:  Recommended long-term night splint and this was dispensed and fitted properly to her foot with instructions on usage and also heat ice therapy which was instructed and given to her and today I did do sterile prep injected the fascia 3 mg Kenalog 5 mg Xylocaine reappoint to recheck again in 3 to 4 weeks

## 2021-09-13 ENCOUNTER — Telehealth: Payer: Self-pay | Admitting: *Deleted

## 2021-09-13 NOTE — Telephone Encounter (Signed)
Patient is requesting something for pain,has been elevating and sleeping in boot but still having problems, starting w/ same pain on other side of foot. Please advise.

## 2021-09-14 NOTE — Telephone Encounter (Signed)
Can take ibuprophen or tylenol

## 2021-09-19 NOTE — Telephone Encounter (Signed)
"  Can I get some type of medicine for the pain?"  I attempted to return her call.  I left her a message of Dr. Beverlee Nims response which is to take Tylenol or Ibuprofen.

## 2021-09-24 DIAGNOSIS — D573 Sickle-cell trait: Secondary | ICD-10-CM | POA: Diagnosis not present

## 2021-09-24 DIAGNOSIS — M25511 Pain in right shoulder: Secondary | ICD-10-CM | POA: Diagnosis not present

## 2021-11-05 DIAGNOSIS — M25511 Pain in right shoulder: Secondary | ICD-10-CM | POA: Diagnosis not present

## 2021-11-05 DIAGNOSIS — M7581 Other shoulder lesions, right shoulder: Secondary | ICD-10-CM | POA: Diagnosis not present

## 2021-11-05 DIAGNOSIS — W57XXXA Bitten or stung by nonvenomous insect and other nonvenomous arthropods, initial encounter: Secondary | ICD-10-CM | POA: Diagnosis not present

## 2021-11-05 DIAGNOSIS — I10 Essential (primary) hypertension: Secondary | ICD-10-CM | POA: Diagnosis not present

## 2021-11-05 DIAGNOSIS — G5791 Unspecified mononeuropathy of right lower limb: Secondary | ICD-10-CM | POA: Diagnosis not present

## 2021-11-05 DIAGNOSIS — R7309 Other abnormal glucose: Secondary | ICD-10-CM | POA: Diagnosis not present

## 2021-12-04 DIAGNOSIS — Z23 Encounter for immunization: Secondary | ICD-10-CM | POA: Diagnosis not present

## 2021-12-04 DIAGNOSIS — I10 Essential (primary) hypertension: Secondary | ICD-10-CM | POA: Diagnosis not present

## 2021-12-10 ENCOUNTER — Encounter: Payer: Self-pay | Admitting: Podiatry

## 2021-12-10 ENCOUNTER — Ambulatory Visit: Payer: Medicare HMO | Admitting: Podiatry

## 2021-12-10 DIAGNOSIS — M722 Plantar fascial fibromatosis: Secondary | ICD-10-CM

## 2021-12-10 MED ORDER — TRIAMCINOLONE ACETONIDE 10 MG/ML IJ SUSP
10.0000 mg | Freq: Once | INTRAMUSCULAR | Status: AC
  Administered 2021-12-10: 10 mg

## 2021-12-11 NOTE — Progress Notes (Signed)
Subjective:   Patient ID: Autumn Adams, female   DOB: 66 y.o.   MRN: [id number]   HPI Patient presents stating her heel has been very sore left and it did not respond to the previous treatments we have done and has been hurting her now for at least 6 months and gradually getting worse especially with ambulation    ROS      Objective:  Physical Exam  Neurovascular status intact with patient found to have exquisite discomfort with swelling around the left medial fascial band at the insertion of the tendon into the calcaneus with fluid buildup     Assessment:  Acute plantar fasciitis left with inflammation fluid around the medial band     Plan:  H&P reviewed condition sterile prep and injected the fascia at insertion 3 mg Kenalog 5 mg Xylocaine and due to the intense discomfort I applied air fracture walker to support the arch and take all pressure off the heel and allow for hopeful healing to occur without surgery.  Reappoint 4 weeks

## 2022-01-07 ENCOUNTER — Encounter (INDEPENDENT_AMBULATORY_CARE_PROVIDER_SITE_OTHER): Payer: Medicare HMO | Admitting: Podiatry

## 2022-01-07 NOTE — Progress Notes (Signed)
No Show This encounter was created in error - please disregard. 

## 2022-01-09 ENCOUNTER — Ambulatory Visit: Payer: Medicare HMO | Admitting: Podiatry

## 2022-01-09 ENCOUNTER — Encounter: Payer: Self-pay | Admitting: Podiatry

## 2022-01-09 DIAGNOSIS — M722 Plantar fascial fibromatosis: Secondary | ICD-10-CM | POA: Diagnosis not present

## 2022-01-09 NOTE — Progress Notes (Signed)
Subjective:   Patient ID: Autumn Adams, female   DOB: 66 y.o.   MRN: [id number]   HPI Patient states she is doing better but still having mild pain   ROS      Objective:  Physical Exam  Neurovascular status intact with discomfort in the plantar aspect of the left heel with air fracture walker that has created significant improvement upon palpation     Assessment:  Improvement plantar fascia with immobilization     Plan:  Reviewed condition continue boot usage for the next several weeks and reappoint if symptoms persist may require more aggressive treatment plan depending

## 2022-01-25 DIAGNOSIS — M5416 Radiculopathy, lumbar region: Secondary | ICD-10-CM | POA: Diagnosis not present

## 2022-02-11 ENCOUNTER — Telehealth: Payer: Self-pay | Admitting: *Deleted

## 2022-02-11 NOTE — Telephone Encounter (Signed)
Patient is requesting a note to be dismissed from jury duty, because her plantar fasciitis, not being able to do a lot of walking, especially since still wearing boot. Will need the note by next couple of days.  Please advise.

## 2022-02-11 NOTE — Telephone Encounter (Signed)
That is fine 

## 2022-02-12 ENCOUNTER — Encounter: Payer: Self-pay | Admitting: *Deleted

## 2022-02-12 NOTE — Telephone Encounter (Signed)
Letter written, patient aware and will pick up from front desk.

## 2022-02-12 NOTE — Progress Notes (Signed)
Letter written , patient will pick from front desk.

## 2022-02-28 DIAGNOSIS — I10 Essential (primary) hypertension: Secondary | ICD-10-CM | POA: Diagnosis not present

## 2022-02-28 DIAGNOSIS — E7800 Pure hypercholesterolemia, unspecified: Secondary | ICD-10-CM | POA: Diagnosis not present

## 2022-03-12 DIAGNOSIS — Z1231 Encounter for screening mammogram for malignant neoplasm of breast: Secondary | ICD-10-CM | POA: Diagnosis not present

## 2022-03-25 DIAGNOSIS — Z03818 Encounter for observation for suspected exposure to other biological agents ruled out: Secondary | ICD-10-CM | POA: Diagnosis not present

## 2022-03-25 DIAGNOSIS — R0600 Dyspnea, unspecified: Secondary | ICD-10-CM | POA: Diagnosis not present

## 2022-03-25 DIAGNOSIS — J329 Chronic sinusitis, unspecified: Secondary | ICD-10-CM | POA: Diagnosis not present

## 2022-03-25 DIAGNOSIS — Z8669 Personal history of other diseases of the nervous system and sense organs: Secondary | ICD-10-CM | POA: Diagnosis not present

## 2022-03-28 ENCOUNTER — Encounter (HOSPITAL_BASED_OUTPATIENT_CLINIC_OR_DEPARTMENT_OTHER): Payer: Self-pay | Admitting: Emergency Medicine

## 2022-03-28 ENCOUNTER — Emergency Department (HOSPITAL_BASED_OUTPATIENT_CLINIC_OR_DEPARTMENT_OTHER): Payer: Medicare HMO

## 2022-03-28 ENCOUNTER — Other Ambulatory Visit: Payer: Self-pay

## 2022-03-28 ENCOUNTER — Emergency Department (HOSPITAL_BASED_OUTPATIENT_CLINIC_OR_DEPARTMENT_OTHER)
Admission: EM | Admit: 2022-03-28 | Discharge: 2022-03-28 | Disposition: A | Discharge status: Home / Self Care | Payer: Medicare HMO | Attending: Emergency Medicine | Admitting: Emergency Medicine

## 2022-03-28 DIAGNOSIS — R0600 Dyspnea, unspecified: Secondary | ICD-10-CM | POA: Diagnosis not present

## 2022-03-28 DIAGNOSIS — Z79899 Other long term (current) drug therapy: Secondary | ICD-10-CM | POA: Diagnosis not present

## 2022-03-28 DIAGNOSIS — R42 Dizziness and giddiness: Secondary | ICD-10-CM | POA: Insufficient documentation

## 2022-03-28 DIAGNOSIS — I499 Cardiac arrhythmia, unspecified: Secondary | ICD-10-CM | POA: Diagnosis not present

## 2022-03-28 DIAGNOSIS — R002 Palpitations: Secondary | ICD-10-CM | POA: Diagnosis not present

## 2022-03-28 DIAGNOSIS — Z7951 Long term (current) use of inhaled steroids: Secondary | ICD-10-CM | POA: Diagnosis not present

## 2022-03-28 DIAGNOSIS — R197 Diarrhea, unspecified: Secondary | ICD-10-CM | POA: Diagnosis not present

## 2022-03-28 DIAGNOSIS — J45909 Unspecified asthma, uncomplicated: Secondary | ICD-10-CM | POA: Insufficient documentation

## 2022-03-28 DIAGNOSIS — Z20822 Contact with and (suspected) exposure to covid-19: Secondary | ICD-10-CM | POA: Diagnosis not present

## 2022-03-28 DIAGNOSIS — Z0389 Encounter for observation for other suspected diseases and conditions ruled out: Secondary | ICD-10-CM | POA: Diagnosis not present

## 2022-03-28 DIAGNOSIS — I10 Essential (primary) hypertension: Secondary | ICD-10-CM | POA: Insufficient documentation

## 2022-03-28 DIAGNOSIS — R0602 Shortness of breath: Secondary | ICD-10-CM | POA: Insufficient documentation

## 2022-03-28 LAB — COMPREHENSIVE METABOLIC PANEL
ALT: 11 U/L (ref 0–44)
AST: 14 U/L — ABNORMAL LOW (ref 15–41)
Albumin: 4.4 g/dL (ref 3.5–5.0)
Alkaline Phosphatase: 59 U/L (ref 38–126)
Anion gap: 9 (ref 5–15)
BUN: 18 mg/dL (ref 8–23)
CO2: 27 mmol/L (ref 22–32)
Calcium: 9.9 mg/dL (ref 8.9–10.3)
Chloride: 103 mmol/L (ref 98–111)
Creatinine, Ser: 1.14 mg/dL — ABNORMAL HIGH (ref 0.44–1.00)
GFR, Estimated: 52 mL/min — ABNORMAL LOW (ref >60)
Glucose, Bld: 129 mg/dL — ABNORMAL HIGH (ref 70–99)
Potassium: 4.2 mmol/L (ref 3.5–5.1)
Sodium: 139 mmol/L (ref 135–145)
Total Bilirubin: 0.8 mg/dL (ref 0.3–1.2)
Total Protein: 7.7 g/dL (ref 6.5–8.1)

## 2022-03-28 LAB — CBC WITH DIFFERENTIAL/PLATELET
Abs Immature Granulocytes: 0.04 10*3/uL (ref 0.00–0.07)
Basophils Absolute: 0 10*3/uL (ref 0.0–0.1)
Basophils Relative: 0 %
Eosinophils Absolute: 0.1 10*3/uL (ref 0.0–0.5)
Eosinophils Relative: 1 %
HCT: 37.9 % (ref 36.0–46.0)
Hemoglobin: 12.4 g/dL (ref 12.0–15.0)
Immature Granulocytes: 0 %
Lymphocytes Relative: 16 %
Lymphs Abs: 1.4 10*3/uL (ref 0.7–4.0)
MCH: 29.1 pg (ref 26.0–34.0)
MCHC: 32.7 g/dL (ref 30.0–36.0)
MCV: 89 fL (ref 80.0–100.0)
Monocytes Absolute: 0.6 10*3/uL (ref 0.1–1.0)
Monocytes Relative: 6 %
Neutro Abs: 7.1 10*3/uL (ref 1.7–7.7)
Neutrophils Relative %: 77 %
Platelets: 269 10*3/uL (ref 150–400)
RBC: 4.26 MIL/uL (ref 3.87–5.11)
RDW: 13.1 % (ref 11.5–15.5)
WBC: 9.3 10*3/uL (ref 4.0–10.5)
nRBC: 0 % (ref 0.0–0.2)

## 2022-03-28 LAB — RESP PANEL BY RT-PCR (RSV, FLU A&B, COVID)  RVPGX2
Influenza A by PCR: NEGATIVE
Influenza B by PCR: NEGATIVE
Resp Syncytial Virus by PCR: NEGATIVE
SARS Coronavirus 2 by RT PCR: NEGATIVE

## 2022-03-28 LAB — TROPONIN I (HIGH SENSITIVITY)
Troponin I (High Sensitivity): <2 ng/L (ref <18)
Troponin I (High Sensitivity): 2 ng/L (ref <18)

## 2022-03-28 LAB — D-DIMER, QUANTITATIVE: D-Dimer, Quant: 0.66 ug/mL-FEU — ABNORMAL HIGH (ref 0.00–0.50)

## 2022-03-28 MED ORDER — IOHEXOL 350 MG/ML SOLN
100.0000 mL | Freq: Once | INTRAVENOUS | Status: AC | PRN
  Administered 2022-03-28: 75 mL via INTRAVENOUS

## 2022-03-28 MED ORDER — IOHEXOL 350 MG/ML SOLN
86.0000 mL | Freq: Once | INTRAVENOUS | Status: AC | PRN
  Administered 2022-03-28: 86 mL via INTRAVENOUS

## 2022-03-28 NOTE — Progress Notes (Signed)
Right forearm contrast extravasation during CT PE.  Patient advised to elevate and apply ice and heat, alternating as needed. Patient denies pain. RN and MD advised, SZP entered.

## 2022-03-28 NOTE — ED Triage Notes (Addendum)
Pt c/o SOB since Sunday intermittent SOB since Sunday. Pt c/o dizziness that started this morning at approx 0930. Pt was evaluated by EMS at that time. Pt denies dizziness and SOB at present but states her biggest concern is having the SOB.   Pt further reports Monday seen by PCP, tx for ear infection with abx.  Taken off of metoprolol and started carvedilol approx 3 weeks ago.

## 2022-03-28 NOTE — ED Notes (Signed)
Patient verbalizes understanding of discharge instructions. Opportunity for questioning and answers were provided. Patient discharged from ED.  °

## 2022-03-28 NOTE — ED Provider Notes (Signed)
3:49 PM Assumed care of patient from off-going team. For more details, please see note from same day.  In brief, this is a 67 y.o. female who p/w SOB thus far with very reassuring w/u. Has h/o asthma but is not wheezing and states this does not feel like her asthma. Did have elevated dimer and presyncopal event.   Plan/Dispo at time of sign-out & ED Course since sign-out: [ ]  CT PE  BP (!) 153/76   Pulse 71   Temp 97.9 F (36.6 C)   Resp 12   Ht 5\' 7"  (1.702 m)   Wt 108.9 kg   SpO2 100%   BMI 37.59 kg/m    ED Course:   Clinical Course as of 04/07/22 2024  Thu Mar 28, 2022  1711 CT Angio Chest PE W and/or Wo Contrast 1. No evidence acute pulmonary embolism. 2. No acute pulmonary parenchymal findings.   [HN]    Clinical Course User Index [HN] Audley Hose, MD    Dispo: Pt w/ negative CT PE. Reassuring dyspnea w/u and she feels improved. No increased WOB, no hypoxia or wheezing at this time. Will be DC'd with return precautions and PCP f/u in 1 week. All questions answered to patient satisfaction.  ------------------------------- Cindee Lame, MD Emergency Medicine  This note was created using dictation software, which may contain spelling or grammatical errors.   Audley Hose, MD 04/07/22 2025

## 2022-03-28 NOTE — ED Provider Notes (Cosign Needed)
[facility] EMERGENCY DEPT Provider Note   CSN: [id number] Arrival date & time: 03/28/22  1033     History  Chief Complaint  Patient presents with   Dizziness   Shortness of Breath    Autumn Adams is a 67 y.o. female presenting with shortness of breath. Symptoms started Sunday (4 days ago) and are intermittent, not worsening. She describes feeling the need to take a deep breath but is unable to get as much air. Tried her albuterol inhaler with no change. States this does not feel similar to her prior asthma exacerbations. This morning had an episode where she became dizzy while sitting at work, felt like she may pass out. This prompted her to call EMS and come to the ED. Dizziness lasted a few minutes. Has resolved currently.    She endorses a few brief episodes of palpitations on Sunday when the shortness of breath started but none since. Lasted seconds and then resolved. Denies chest pain, cough, congestion, fever, leg swelling or GI symptoms. Patient reports the shortness of breath is not interfering with her daily function or preventing her usual activities. She saw her PCP on Monday, 1 day after her symptoms started. Was started on amoxicillin for possible L ear infection.     Home Medications Prior to Admission medications   Medication Sig Start Date End Date Taking? Authorizing Provider  allopurinol (ZYLOPRIM) 100 MG tablet Take 100 mg by mouth daily. 05/31/14   [provider]  amLODipine-benazepril (LOTREL) 10-20 MG per capsule Take 1 capsule by mouth daily. 02/26/14   [provider]  amoxicillin (AMOXIL) 500 MG capsule Take 500 mg by mouth every 8 (eight) hours. 03/26/22   [provider]  carvedilol (COREG) 25 MG tablet Take 25 mg by mouth 2 (two) times daily. 03/25/22   [provider]  cloNIDine (CATAPRES) 0.1 MG tablet Take 0.1 mg by mouth at bedtime. 10/06/20   [provider]  cyclobenzaprine (FLEXERIL) 10 MG  tablet Take 1 tablet (10 mg total) by mouth 2 (two) times daily as needed for muscle spasms. 03/05/14   Elpidio Anis, PA-C  hydrALAZINE (APRESOLINE) 50 MG tablet Take 50 mg by mouth 2 (two) times daily. 03/04/22   [provider]  HYDROcodone-acetaminophen (NORCO/VICODIN) 5-325 MG per tablet Take 1-2 tablets by mouth every 4 (four) hours as needed. 03/05/14   Elpidio Anis, PA-C  indomethacin (INDOCIN) 25 MG capsule  07/04/14   [provider]  methocarbamol (ROBAXIN) 500 MG tablet Take 500 mg by mouth every 6 (six) hours as needed. 11/07/20   [provider]  metoprolol succinate (TOPROL-XL) 50 MG 24 hr tablet Take 50 mg by mouth daily. 01/26/14   [provider]  metoprolol tartrate (LOPRESSOR) 25 MG tablet Take 25 mg by mouth 2 (two) times daily. 11/14/20   [provider]  naproxen sodium (ANAPROX) 220 MG tablet Take 440 mg by mouth 2 (two) times daily as needed (pain).    [provider]  predniSONE (DELTASONE) 10 MG tablet Take 6 tablets day 1 Take 5 tablets day 2 Take 4 tablets day 3 Take 3 tablets day 4  Take 2 tablets day 5 Take 1 tablet day 6 03/05/14   Upstill, Melvenia Beam, PA-C  probenecid (BENEMID) 500 MG tablet Take 250 mg by mouth 2 (two) times daily. 02/26/14   [provider]  rosuvastatin (CRESTOR) 5 MG tablet Take by mouth. 03/27/22   [provider]  traMADol (ULTRAM) 50 MG tablet Take 50-100  mg by mouth every 6 (six) hours as needed. 11/07/20   [provider]  VENTOLIN HFA 108 (90 BASE) MCG/ACT inhaler Inhale 2 puffs into the lungs every 4 (four) hours as needed for wheezing or shortness of breath.  01/19/14   [provider]      Allergies    Patient has no known allergies.    Review of Systems   Review of Systems  Constitutional:  Negative for chills, diaphoresis and fever.  HENT:  Negative for rhinorrhea.   Respiratory:  Positive for shortness of breath. Negative for cough.    Cardiovascular:  Positive for palpitations. Negative for chest pain and leg swelling.  Gastrointestinal:  Negative for abdominal pain, diarrhea, nausea and vomiting.  Neurological:  Positive for dizziness. Negative for syncope, weakness and headaches.    Physical Exam Updated Vital Signs BP (!) 153/76   Pulse 77   Temp 97.9 F (36.6 C)   Resp 16   Ht 5\' 7"  (1.702 m)   Wt 108.9 kg   SpO2 100%   BMI 37.59 kg/m  Physical Exam Constitutional:      General: She is not in acute distress. HENT:     Head: Normocephalic and atraumatic.     Mouth/Throat:     Mouth: Mucous membranes are moist.  Eyes:     Extraocular Movements: Extraocular movements intact.     Pupils: Pupils are equal, round, and reactive to light.  Cardiovascular:     Rate and Rhythm: Normal rate and regular rhythm.     Heart sounds: Normal heart sounds.  Pulmonary:     Effort: Pulmonary effort is normal.     Breath sounds: Normal breath sounds. No wheezing or rales.  Abdominal:     Palpations: Abdomen is soft.     Tenderness: There is no abdominal tenderness.  Musculoskeletal:     Cervical back: Neck supple.     Right lower leg: No edema.     Left lower leg: No edema.  Lymphadenopathy:     Cervical: No cervical adenopathy.  Skin:    General: Skin is warm and dry.  Neurological:     General: No focal deficit present.     Mental Status: She is alert.     Cranial Nerves: No cranial nerve deficit.     ED Results / Procedures / Treatments   Labs (all labs ordered are listed, but only abnormal results are displayed) Labs Reviewed  COMPREHENSIVE METABOLIC PANEL - Abnormal; Notable for the following components:      Result Value   Glucose, Bld 129 (*)    Creatinine, Ser 1.14 (*)    AST 14 (*)    GFR, Estimated 52 (*)    All other components within normal limits  D-DIMER, QUANTITATIVE - Abnormal; Notable for the following components:   D-Dimer, Quant 0.66 (*)    All other components within normal  limits  RESP PANEL BY RT-PCR (RSV, FLU A&B, COVID)  [id number]  CBC WITH DIFFERENTIAL/PLATELET  TROPONIN I (HIGH SENSITIVITY)  TROPONIN I (HIGH SENSITIVITY)    EKG EKG Interpretation  Date/Time:  Thursday March 28 2022 10:40:58 EST Ventricular Rate:  68 PR Interval:  153 QRS Duration: 101 QT Interval:  396 QTC Calculation: 422 R Axis:   26 Text Interpretation: Sinus rhythm Confirmed by Campbell Stall ([id number]) on 03/18/7508 11:16:34 AM  Radiology CT Angio Chest PE W and/or Wo Contrast  Result Date: 03/28/2022 CLINICAL DATA:  Pulmonary suspected. Low to intermediate probability. EXAM: CT  ANGIOGRAPHY CHEST WITH CONTRAST TECHNIQUE: Multidetector CT imaging of the chest was performed using the standard protocol during bolus administration of intravenous contrast. Multiplanar CT image reconstructions and MIPs were obtained to evaluate the vascular anatomy. RADIATION DOSE REDUCTION: This exam was performed according to the departmental dose-optimization program which includes automated exposure control, adjustment of the mA and/or kV according to patient size and/or use of iterative reconstruction technique. CONTRAST:  75mL OMNIPAQUE IOHEXOL 350 MG/ML SOLN, 57mL OMNIPAQUE IOHEXOL 350 MG/ML SOLN COMPARISON:  None FINDINGS: Cardiovascular: No filling defects within the pulmonary arteries to suggest acute pulmonary embolism. Mediastinum/Nodes: No axillary or supraclavicular adenopathy. No mediastinal or hilar adenopathy. No pericardial fluid. Esophagus normal. Lungs/Pleura: No pulmonary infarction. No pneumonia. No pleural fluid. No pneumothorax Upper Abdomen: Limited view of the liver, kidneys, pancreas are unremarkable. Normal adrenal glands. Musculoskeletal: No aggressive osseous lesion. Degenerative osteophytosis of the spine. Review of the MIP images confirms the above findings. IMPRESSION: 1. No evidence acute pulmonary embolism. 2. No acute pulmonary parenchymal findings. Electronically Signed   By:  Suzy Bouchard M.D.   On: 03/28/2022 17:02   DG Chest Portable 1 View  Result Date: 03/28/2022 CLINICAL DATA:  Shortness of breath and dizziness. EXAM: PORTABLE CHEST 1 VIEW COMPARISON:  None Available. FINDINGS: The cardiac silhouette is mildly enlarged. The interstitial markings are mildly prominent without overt edema. No focal airspace consolidation, sizeable pleural effusion, or pneumothorax is identified. No acute osseous abnormality is seen. IMPRESSION: No active disease. Electronically Signed   By: Logan Bores M.D.   On: 03/28/2022 11:40    Procedures Procedures    Medications Ordered in ED Medications  iohexol (OMNIPAQUE) 350 MG/ML injection 100 mL (75 mLs Intravenous Contrast Given 03/28/22 1500)  iohexol (OMNIPAQUE) 350 MG/ML injection 86 mL (86 mLs Intravenous Contrast Given 03/28/22 1648)    ED Course/ Medical Decision Making/ A&P                          Medical Decision Making Amount and/or Complexity of Data Reviewed Labs: ordered. Radiology: ordered. Decision-making details documented in ED Course.  Risk Prescription drug management.   This is a 67 year old female with PMH significant for asthma and HTN presenting with 4 days of shortness of breath and episode of dizziness this morning. With regard to patient's dyspnea, differential is broad and includes viral illness, asthma exacerbation, CAP, PE, MI, anxiety, new onset CHF, less likely pleural effusion, pneumothorax, anemia. Differential for her episode of dizziness this morning includes hypotension, vasovagal reaction, BPPV, arrhythmia, anxiety, anemia. Reassuringly, patient with normal vitals and unremarkable physical exam. EKG obtained on arrival shows NSR without acute changes. Will check CXR as well as CBC, CMP, viral quad screen, troponin, and d-dimer.   CXR completed and images independently reviewed-- no acute findings. Lab results within normal ranges aside from slightly elevated D-dimer. Therefore will  proceed with CTA chest to r/o PE.  Upon re-evaluation, patient remains comfortable without further episodes of dizziness. Vital signs stable, respiratory and neuro exams remain normal.  IV infiltrated when patient went for CTA, therefore it was unable to be completed. New IV placed and infiltrated x2.  Patient signed out to oncoming provider, Dr. Mayra Neer, at change of shift to follow-up results of PE study. If negative, anticipate discharge home with PCP and cardiology follow-up which are already in place.   Final Clinical Impression(s) / ED Diagnoses Final diagnoses:  Dyspnea, unspecified type    Rx / DC Orders ED  Discharge Orders     None         Maury Dus, MD 03/29/22 [phone]

## 2022-03-28 NOTE — ED Notes (Signed)
Orthostatic VS completed. Pt tolerated well.  Orthostatic Lying BP: 129/66                                        Pulse- Lying: 66 Orthostatic Sitting BP: 119/61                                       Pulse- Sitting: 75 Orthostatic Standing at 0 minutes BP: 108/61              Pulse- Standing at 0 minutes: 76 Orthostatic Standing at 3 minutes BP: 119/51              Pulse- Standing at 3 minutes: 72

## 2022-03-28 NOTE — Discharge Instructions (Signed)
Thank you for coming to [facility] Emergency Department. You were seen for shortness of breath and lightheadedness. We did an exam, labs, and imaging, and these showed no acute findings.  Please follow up with your primary care provider within 1 week.   Do not hesitate to return to the ED or call 911 if you experience: -Worsening symptoms -Chest pain, worsening shortness of breath -Lightheadedness, passing out -Fevers/chills -Anything else that concerns you

## 2022-04-04 DIAGNOSIS — M10071 Idiopathic gout, right ankle and foot: Secondary | ICD-10-CM | POA: Diagnosis not present

## 2022-04-04 DIAGNOSIS — J018 Other acute sinusitis: Secondary | ICD-10-CM | POA: Diagnosis not present

## 2022-04-04 DIAGNOSIS — I10 Essential (primary) hypertension: Secondary | ICD-10-CM | POA: Diagnosis not present

## 2022-04-04 DIAGNOSIS — D573 Sickle-cell trait: Secondary | ICD-10-CM | POA: Diagnosis not present

## 2022-04-08 DIAGNOSIS — F411 Generalized anxiety disorder: Secondary | ICD-10-CM | POA: Diagnosis not present

## 2022-04-08 DIAGNOSIS — F410 Panic disorder [episodic paroxysmal anxiety] without agoraphobia: Secondary | ICD-10-CM | POA: Diagnosis not present

## 2022-04-18 ENCOUNTER — Ambulatory Visit: Payer: Medicare PPO | Admitting: Internal Medicine

## 2022-05-13 DIAGNOSIS — Z1331 Encounter for screening for depression: Secondary | ICD-10-CM | POA: Diagnosis not present

## 2022-05-13 DIAGNOSIS — H269 Unspecified cataract: Secondary | ICD-10-CM | POA: Diagnosis not present

## 2022-05-13 DIAGNOSIS — E669 Obesity, unspecified: Secondary | ICD-10-CM | POA: Diagnosis not present

## 2022-05-13 DIAGNOSIS — Z0000 Encounter for general adult medical examination without abnormal findings: Secondary | ICD-10-CM | POA: Diagnosis not present

## 2022-05-13 DIAGNOSIS — Z1239 Encounter for other screening for malignant neoplasm of breast: Secondary | ICD-10-CM | POA: Diagnosis not present

## 2022-05-13 DIAGNOSIS — J4520 Mild intermittent asthma, uncomplicated: Secondary | ICD-10-CM | POA: Diagnosis not present

## 2022-05-13 DIAGNOSIS — I10 Essential (primary) hypertension: Secondary | ICD-10-CM | POA: Diagnosis not present

## 2022-05-13 DIAGNOSIS — J309 Allergic rhinitis, unspecified: Secondary | ICD-10-CM | POA: Diagnosis not present

## 2022-05-13 DIAGNOSIS — R7303 Prediabetes: Secondary | ICD-10-CM | POA: Diagnosis not present

## 2022-05-13 DIAGNOSIS — E7800 Pure hypercholesterolemia, unspecified: Secondary | ICD-10-CM | POA: Diagnosis not present

## 2022-05-13 DIAGNOSIS — Z79899 Other long term (current) drug therapy: Secondary | ICD-10-CM | POA: Diagnosis not present

## 2022-05-13 DIAGNOSIS — M109 Gout, unspecified: Secondary | ICD-10-CM | POA: Diagnosis not present

## 2022-05-13 DIAGNOSIS — E559 Vitamin D deficiency, unspecified: Secondary | ICD-10-CM | POA: Diagnosis not present

## 2022-05-29 ENCOUNTER — Ambulatory Visit: Payer: Medicare HMO | Admitting: Internal Medicine

## 2022-06-04 ENCOUNTER — Encounter: Payer: Self-pay | Admitting: General Practice

## 2022-08-07 DIAGNOSIS — M4316 Spondylolisthesis, lumbar region: Secondary | ICD-10-CM | POA: Diagnosis not present

## 2022-08-07 DIAGNOSIS — Z6837 Body mass index (BMI) 37.0-37.9, adult: Secondary | ICD-10-CM | POA: Diagnosis not present

## 2022-08-07 DIAGNOSIS — M5416 Radiculopathy, lumbar region: Secondary | ICD-10-CM | POA: Diagnosis not present

## 2022-08-15 DIAGNOSIS — M5416 Radiculopathy, lumbar region: Secondary | ICD-10-CM | POA: Diagnosis not present

## 2022-09-18 DIAGNOSIS — G4700 Insomnia, unspecified: Secondary | ICD-10-CM | POA: Diagnosis not present

## 2022-09-18 DIAGNOSIS — M25511 Pain in right shoulder: Secondary | ICD-10-CM | POA: Diagnosis not present

## 2022-09-18 DIAGNOSIS — J329 Chronic sinusitis, unspecified: Secondary | ICD-10-CM | POA: Diagnosis not present

## 2022-11-05 ENCOUNTER — Other Ambulatory Visit: Payer: Self-pay | Admitting: Internal Medicine

## 2022-11-05 ENCOUNTER — Ambulatory Visit: Admission: RE | Admit: 2022-11-05 | Payer: Medicare HMO | Source: Ambulatory Visit

## 2022-11-05 DIAGNOSIS — M25511 Pain in right shoulder: Secondary | ICD-10-CM | POA: Diagnosis not present

## 2022-11-05 DIAGNOSIS — M19011 Primary osteoarthritis, right shoulder: Secondary | ICD-10-CM | POA: Diagnosis not present

## 2022-11-11 DIAGNOSIS — M25511 Pain in right shoulder: Secondary | ICD-10-CM | POA: Diagnosis not present

## 2022-11-11 DIAGNOSIS — E559 Vitamin D deficiency, unspecified: Secondary | ICD-10-CM | POA: Diagnosis not present

## 2022-11-11 DIAGNOSIS — G4700 Insomnia, unspecified: Secondary | ICD-10-CM | POA: Diagnosis not present

## 2022-11-11 DIAGNOSIS — E7800 Pure hypercholesterolemia, unspecified: Secondary | ICD-10-CM | POA: Diagnosis not present

## 2022-11-11 DIAGNOSIS — I10 Essential (primary) hypertension: Secondary | ICD-10-CM | POA: Diagnosis not present

## 2022-11-11 DIAGNOSIS — R7303 Prediabetes: Secondary | ICD-10-CM | POA: Diagnosis not present

## 2022-11-11 DIAGNOSIS — J4520 Mild intermittent asthma, uncomplicated: Secondary | ICD-10-CM | POA: Diagnosis not present

## 2022-11-11 DIAGNOSIS — H269 Unspecified cataract: Secondary | ICD-10-CM | POA: Diagnosis not present

## 2022-11-11 DIAGNOSIS — M109 Gout, unspecified: Secondary | ICD-10-CM | POA: Diagnosis not present

## 2022-11-19 DIAGNOSIS — M5416 Radiculopathy, lumbar region: Secondary | ICD-10-CM | POA: Diagnosis not present

## 2022-11-26 DIAGNOSIS — M7541 Impingement syndrome of right shoulder: Secondary | ICD-10-CM | POA: Diagnosis not present

## 2022-12-31 DIAGNOSIS — M25511 Pain in right shoulder: Secondary | ICD-10-CM | POA: Insufficient documentation

## 2022-12-31 DIAGNOSIS — M7541 Impingement syndrome of right shoulder: Secondary | ICD-10-CM | POA: Insufficient documentation

## 2023-02-06 DIAGNOSIS — Z6837 Body mass index (BMI) 37.0-37.9, adult: Secondary | ICD-10-CM | POA: Diagnosis not present

## 2023-02-06 DIAGNOSIS — M5416 Radiculopathy, lumbar region: Secondary | ICD-10-CM | POA: Diagnosis not present

## 2023-02-25 DIAGNOSIS — M5416 Radiculopathy, lumbar region: Secondary | ICD-10-CM | POA: Diagnosis not present

## 2023-03-18 DIAGNOSIS — Z1231 Encounter for screening mammogram for malignant neoplasm of breast: Secondary | ICD-10-CM | POA: Diagnosis not present

## 2023-03-18 DIAGNOSIS — R29890 Loss of height: Secondary | ICD-10-CM | POA: Diagnosis not present

## 2023-03-18 DIAGNOSIS — N958 Other specified menopausal and perimenopausal disorders: Secondary | ICD-10-CM | POA: Diagnosis not present

## 2023-03-18 DIAGNOSIS — E2839 Other primary ovarian failure: Secondary | ICD-10-CM | POA: Diagnosis not present

## 2023-05-15 DIAGNOSIS — E7800 Pure hypercholesterolemia, unspecified: Secondary | ICD-10-CM | POA: Diagnosis not present

## 2023-05-15 DIAGNOSIS — R7303 Prediabetes: Secondary | ICD-10-CM | POA: Diagnosis not present

## 2023-05-15 DIAGNOSIS — M19011 Primary osteoarthritis, right shoulder: Secondary | ICD-10-CM | POA: Diagnosis not present

## 2023-05-15 DIAGNOSIS — Z0000 Encounter for general adult medical examination without abnormal findings: Secondary | ICD-10-CM | POA: Diagnosis not present

## 2023-05-15 DIAGNOSIS — G4700 Insomnia, unspecified: Secondary | ICD-10-CM | POA: Diagnosis not present

## 2023-05-15 DIAGNOSIS — M109 Gout, unspecified: Secondary | ICD-10-CM | POA: Diagnosis not present

## 2023-05-15 DIAGNOSIS — Z79899 Other long term (current) drug therapy: Secondary | ICD-10-CM | POA: Diagnosis not present

## 2023-05-15 DIAGNOSIS — H269 Unspecified cataract: Secondary | ICD-10-CM | POA: Diagnosis not present

## 2023-05-15 DIAGNOSIS — Z23 Encounter for immunization: Secondary | ICD-10-CM | POA: Diagnosis not present

## 2023-05-15 DIAGNOSIS — E559 Vitamin D deficiency, unspecified: Secondary | ICD-10-CM | POA: Diagnosis not present

## 2023-05-15 DIAGNOSIS — I10 Essential (primary) hypertension: Secondary | ICD-10-CM | POA: Diagnosis not present

## 2023-05-15 DIAGNOSIS — Z1331 Encounter for screening for depression: Secondary | ICD-10-CM | POA: Diagnosis not present

## 2023-06-05 DIAGNOSIS — M5416 Radiculopathy, lumbar region: Secondary | ICD-10-CM | POA: Diagnosis not present

## 2023-08-25 DIAGNOSIS — J0100 Acute maxillary sinusitis, unspecified: Secondary | ICD-10-CM | POA: Diagnosis not present

## 2023-09-08 ENCOUNTER — Other Ambulatory Visit (HOSPITAL_COMMUNITY): Payer: Self-pay | Admitting: Internal Medicine

## 2023-09-08 ENCOUNTER — Ambulatory Visit (HOSPITAL_COMMUNITY)
Admission: RE | Admit: 2023-09-08 | Discharge: 2023-09-08 | Disposition: A | Discharge status: Home / Self Care | Source: Ambulatory Visit | Attending: Cardiology | Admitting: Cardiology

## 2023-09-08 DIAGNOSIS — M79605 Pain in left leg: Secondary | ICD-10-CM

## 2023-09-08 DIAGNOSIS — M7989 Other specified soft tissue disorders: Secondary | ICD-10-CM | POA: Diagnosis not present

## 2023-09-09 DIAGNOSIS — M4316 Spondylolisthesis, lumbar region: Secondary | ICD-10-CM | POA: Diagnosis not present

## 2023-09-09 DIAGNOSIS — M5416 Radiculopathy, lumbar region: Secondary | ICD-10-CM | POA: Diagnosis not present

## 2023-09-22 DIAGNOSIS — S86812A Strain of other muscle(s) and tendon(s) at lower leg level, left leg, initial encounter: Secondary | ICD-10-CM | POA: Diagnosis not present

## 2023-09-22 DIAGNOSIS — M25562 Pain in left knee: Secondary | ICD-10-CM | POA: Diagnosis not present

## 2023-10-09 DIAGNOSIS — M5416 Radiculopathy, lumbar region: Secondary | ICD-10-CM | POA: Diagnosis not present

## 2023-11-14 DIAGNOSIS — R42 Dizziness and giddiness: Secondary | ICD-10-CM | POA: Diagnosis not present

## 2023-11-14 DIAGNOSIS — I959 Hypotension, unspecified: Secondary | ICD-10-CM | POA: Diagnosis not present

## 2023-11-14 DIAGNOSIS — R7303 Prediabetes: Secondary | ICD-10-CM | POA: Diagnosis not present

## 2023-12-01 DIAGNOSIS — M25562 Pain in left knee: Secondary | ICD-10-CM | POA: Insufficient documentation

## 2023-12-02 DIAGNOSIS — M25362 Other instability, left knee: Secondary | ICD-10-CM | POA: Diagnosis not present

## 2023-12-16 DIAGNOSIS — J4520 Mild intermittent asthma, uncomplicated: Secondary | ICD-10-CM | POA: Diagnosis not present

## 2023-12-16 DIAGNOSIS — J069 Acute upper respiratory infection, unspecified: Secondary | ICD-10-CM | POA: Diagnosis not present

## 2023-12-26 ENCOUNTER — Ambulatory Visit
Admission: RE | Admit: 2023-12-26 | Discharge: 2023-12-26 | Disposition: A | Discharge status: Home / Self Care | Source: Ambulatory Visit | Attending: Internal Medicine | Admitting: Internal Medicine

## 2023-12-26 ENCOUNTER — Other Ambulatory Visit: Payer: Self-pay | Admitting: Internal Medicine

## 2023-12-26 DIAGNOSIS — R059 Cough, unspecified: Secondary | ICD-10-CM | POA: Diagnosis not present

## 2023-12-26 DIAGNOSIS — I10 Essential (primary) hypertension: Secondary | ICD-10-CM | POA: Diagnosis not present

## 2023-12-26 DIAGNOSIS — R052 Subacute cough: Secondary | ICD-10-CM | POA: Diagnosis not present

## 2023-12-26 DIAGNOSIS — J309 Allergic rhinitis, unspecified: Secondary | ICD-10-CM | POA: Diagnosis not present

## 2023-12-26 DIAGNOSIS — J4521 Mild intermittent asthma with (acute) exacerbation: Secondary | ICD-10-CM | POA: Diagnosis not present

## 2023-12-31 ENCOUNTER — Other Ambulatory Visit (HOSPITAL_COMMUNITY): Payer: Self-pay | Admitting: Internal Medicine

## 2023-12-31 DIAGNOSIS — R052 Subacute cough: Secondary | ICD-10-CM | POA: Diagnosis not present

## 2023-12-31 DIAGNOSIS — I517 Cardiomegaly: Secondary | ICD-10-CM

## 2023-12-31 DIAGNOSIS — Z23 Encounter for immunization: Secondary | ICD-10-CM | POA: Diagnosis not present

## 2023-12-31 DIAGNOSIS — I10 Essential (primary) hypertension: Secondary | ICD-10-CM | POA: Diagnosis not present

## 2024-01-05 ENCOUNTER — Ambulatory Visit: Admission: EM | Admit: 2024-01-05 | Discharge: 2024-01-05 | Disposition: A | Discharge status: Home / Self Care

## 2024-01-05 ENCOUNTER — Ambulatory Visit (INDEPENDENT_AMBULATORY_CARE_PROVIDER_SITE_OTHER)

## 2024-01-05 DIAGNOSIS — M5416 Radiculopathy, lumbar region: Secondary | ICD-10-CM | POA: Insufficient documentation

## 2024-01-05 DIAGNOSIS — R053 Chronic cough: Secondary | ICD-10-CM

## 2024-01-05 DIAGNOSIS — R062 Wheezing: Secondary | ICD-10-CM | POA: Diagnosis not present

## 2024-01-05 DIAGNOSIS — G9331 Postviral fatigue syndrome: Secondary | ICD-10-CM | POA: Diagnosis not present

## 2024-01-05 DIAGNOSIS — R0602 Shortness of breath: Secondary | ICD-10-CM | POA: Diagnosis not present

## 2024-01-05 DIAGNOSIS — I771 Stricture of artery: Secondary | ICD-10-CM | POA: Diagnosis not present

## 2024-01-05 DIAGNOSIS — L439 Lichen planus, unspecified: Secondary | ICD-10-CM | POA: Insufficient documentation

## 2024-01-05 MED ORDER — PROMETHAZINE-DM 6.25-15 MG/5ML PO SYRP: 10.0000 mL | ORAL_SOLUTION | Freq: Three times a day (TID) | ORAL | 0 refills | Status: AC | PRN

## 2024-01-05 MED ORDER — AZELASTINE HCL 0.1 % NA SOLN: 1.0000 | Freq: Two times a day (BID) | NASAL | 1 refills | Status: AC

## 2024-01-05 MED ORDER — IPRATROPIUM-ALBUTEROL 0.5-2.5 (3) MG/3ML IN SOLN
3.0000 mL | Freq: Once | RESPIRATORY_TRACT | Status: AC
  Administered 2024-01-05: 3 mL via RESPIRATORY_TRACT

## 2024-01-05 MED ORDER — PREDNISONE 20 MG PO TABS: 40.0000 mg | ORAL_TABLET | Freq: Every day | ORAL | 0 refills | Status: AC

## 2024-01-05 NOTE — ED Provider Notes (Signed)
 [facility]  Note:  This document was prepared using Conservation officer, historic buildings and may include unintentional dictation errors.  MRN: [id number] DOB: 1955/09/10  Subjective:   Autumn Adams is a 68 y.o. female presenting for cold-like symptoms with persistent cough, wheezing, chest congestion, nasal congestion, and dyspnea with exertion x 1 month.  Patient reports that since onset of symptoms she has been seen multiple times for the same symptoms with no improvement.  Patient states that she had a chest x-ray last week and was told there was no pneumonia.  Patient reports that shortness of breath and dyspnea has become worse.  Patient having severe wheezing even worse when lying down.  Patient denies any known sick contacts.   Current Facility-Administered Medications:    triamcinolone  acetonide (KENALOG ) 10 MG/ML injection 10 mg, 10 mg, Other, Once, [place], [place] RAMAN, DPM  Current Outpatient Medications:    ADVAIR HFA 45-21 MCG/ACT inhaler, Inhale into the lungs., Disp: , Rfl:    amoxicillin-clavulanate (AUGMENTIN) 875-125 MG tablet, SMARTSIG:1 Tablet(s) By Mouth Every 12 Hours, Disp: , Rfl:    azithromycin (ZITHROMAX) 250 MG tablet, Take by mouth., Disp: , Rfl:    chlorpheniramine-HYDROcodone  (TUSSIONEX) 10-8 MG/5ML, SMARTSIG:5 Milliliter(s) By Mouth Every 12 Hours PRN, Disp: , Rfl:    HYDROcodone  bit-homatropine (HYCODAN) 5-1.5 MG/5ML syrup, Take 5 mLs by mouth every 6 (six) hours as needed., Disp: , Rfl:    meloxicam (MOBIC) 7.5 MG tablet, Take 1 tablet every day by oral route., Disp: , Rfl:    allopurinol (ZYLOPRIM) 100 MG tablet, Take 100 mg by mouth daily., Disp: , Rfl: 5   amLODipine (NORVASC) 10 MG tablet, , Disp: , Rfl:    amLODipine-benazepril (LOTREL) 10-20 MG per capsule, Take 1 capsule by mouth daily., Disp: , Rfl: 6   amoxicillin (AMOXIL) 500 MG capsule, Take 500 mg by mouth every 8 (eight) hours., Disp: , Rfl:    azelastine (ASTELIN) 0.1 % nasal spray,  Place 1 spray into both nostrils 2 (two) times daily. Use in each nostril as directed, Disp: 30 mL, Rfl: 1   carvedilol (COREG) 25 MG tablet, Take 25 mg by mouth 2 (two) times daily., Disp: , Rfl:    cloNIDine (CATAPRES) 0.1 MG tablet, Take 0.1 mg by mouth at bedtime., Disp: , Rfl:    cyclobenzaprine  (FLEXERIL ) 10 MG tablet, Take 1 tablet (10 mg total) by mouth 2 (two) times daily as needed for muscle spasms., Disp: 20 tablet, Rfl: 0   hydrALAZINE (APRESOLINE) 50 MG tablet, Take 50 mg by mouth 2 (two) times daily., Disp: , Rfl:    HYDROcodone -acetaminophen  (NORCO/VICODIN) 5-325 MG per tablet, Take 1-2 tablets by mouth every 4 (four) hours as needed., Disp: 12 tablet, Rfl: 0   hydroquinone 4 % cream, as directed Externally for drk spots legs (COSMETIC USE) daily; Duration: 60, Disp: , Rfl:    indomethacin (INDOCIN) 25 MG capsule, , Disp: , Rfl: 1   methocarbamol (ROBAXIN) 500 MG tablet, Take 500 mg by mouth every 6 (six) hours as needed., Disp: , Rfl:    metoprolol succinate (TOPROL-XL) 50 MG 24 hr tablet, Take 50 mg by mouth daily., Disp: , Rfl: 5   metoprolol tartrate (LOPRESSOR) 25 MG tablet, Take 25 mg by mouth 2 (two) times daily., Disp: , Rfl:    naproxen sodium (ANAPROX) 220 MG tablet, Take 440 mg by mouth 2 (two) times daily as needed (pain)., Disp: , Rfl:    predniSONE  (DELTASONE ) 20 MG tablet, Take 2 tablets (  40 mg total) by mouth daily for 5 days., Disp: 10 tablet, Rfl: 0   probenecid (BENEMID) 500 MG tablet, Take 250 mg by mouth 2 (two) times daily., Disp: , Rfl: 11   promethazine-dextromethorphan (PROMETHAZINE-DM) 6.25-15 MG/5ML syrup, Take 10 mLs by mouth 3 (three) times daily as needed., Disp: 240 mL, Rfl: 0   rosuvastatin (CRESTOR) 5 MG tablet, Take by mouth., Disp: , Rfl:    traMADol (ULTRAM) 50 MG tablet, Take 50-100 mg by mouth every 6 (six) hours as needed., Disp: , Rfl:    VENTOLIN HFA 108 (90 BASE) MCG/ACT inhaler, Inhale 2 puffs into the lungs every 4 (four) hours as needed for  wheezing or shortness of breath. , Disp: , Rfl: 1   Allergies  Allergen Reactions   Ace Inhibitors     Other Reaction(s): Not available   Hydrochlorothiazide     Other Reaction(s): Not available   Shellfish Allergy     Other Reaction(s): Not available    Past Medical History:  Diagnosis Date   Anxiety    Asthma    Dyspnea    Hypertension    Plantar fasciitis    Radiculopathy of lumbar region    Sinusitis      History reviewed. No pertinent surgical history.  History reviewed. No pertinent family history.  Social History   Tobacco Use   Smoking status: Never   Smokeless tobacco: Never  Vaping Use   Vaping status: Never Used  Substance Use Topics   Alcohol use: No   Drug use: No    ROS Refer to HPI for ROS details.  Objective:   Vitals: BP (!) 154/82 (BP Location: Left Arm)   Pulse 90   Temp 98.2 F (36.8 C) (Temporal)   Resp (!) 30   Ht 5' 7 (1.702 m)   Wt 240 lb 1.3 oz (108.9 kg)   SpO2 96%   BMI 37.60 kg/m   Physical Exam Vitals and nursing note reviewed.  Constitutional:      General: She is not in acute distress.    Appearance: Normal appearance. She is well-developed. She is not ill-appearing or toxic-appearing.  HENT:     Head: Normocephalic.     Nose: Congestion and rhinorrhea present.     Mouth/Throat:     Mouth: Mucous membranes are moist.     Pharynx: Oropharynx is clear. Posterior oropharyngeal erythema present. No oropharyngeal exudate.  Eyes:     Extraocular Movements: Extraocular movements intact.     Conjunctiva/sclera: Conjunctivae normal.  Cardiovascular:     Rate and Rhythm: Normal rate and regular rhythm.     Heart sounds: Normal heart sounds. No murmur heard. Pulmonary:     Effort: Pulmonary effort is normal. No respiratory distress.     Breath sounds: No stridor. Wheezing and rhonchi present. No rales.  Chest:     Chest wall: No tenderness.  Musculoskeletal:     Cervical back: Normal range of motion and neck supple.   Skin:    General: Skin is warm and dry.  Neurological:     General: No focal deficit present.     Mental Status: She is alert and oriented to person, place, and time.  Psychiatric:        Mood and Affect: Mood normal.        Behavior: Behavior normal.     Procedures  No results found for this or any previous visit (from the past 24 hours).  DG Chest 2 View Result Date: 01/05/2024  CLINICAL DATA:  Persistent cough.  Shortness of breath and wheezing. EXAM: CHEST - 2 VIEW COMPARISON:  12/26/2023 FINDINGS: Borderline cardiomegaly with a mild decrease in size. Tortuous aorta. Clear lungs with normal vascularity. Thoracic spine degenerative changes. IMPRESSION: No acute abnormality. Electronically Signed   By: Elspeth Bathe M.D.   On: 01/05/2024 15:30     Assessment and Plan :     Discharge Instructions       1. Postviral syndrome (Primary) - ipratropium-albuterol (DUONEB) 0.5-2.5 (3) MG/3ML nebulizer solution 3 mL complaining UC for acute wheezing and persistent cough - DG Chest 2 View x-ray completed in UC shows no acute cardiopulmonary processes, no sign of consolidation or pneumonia, normal chest x-ray. - predniSONE  (DELTASONE ) 20 MG tablet; Take 2 tablets (40 mg total) by mouth daily for 5 days.  Dispense: 10 tablet; Refill: 0 - promethazine-dextromethorphan (PROMETHAZINE-DM) 6.25-15 MG/5ML syrup; Take 10 mLs by mouth 3 (three) times daily as needed.  Dispense: 240 mL; Refill: 0 - azelastine (ASTELIN) 0.1 % nasal spray; Place 1 spray into both nostrils 2 (two) times daily. Use in each nostril as directed  Dispense: 30 mL; Refill: 1 -Continue to monitor symptoms for any change in severity if there is any escalation of current symptoms or development of new symptoms follow-up in ER for further evaluation and management.       Tema Alire B Bladen Umar   Waynetta Metheny, [place], [place] 01/05/24 1540

## 2024-01-05 NOTE — ED Triage Notes (Signed)
 Patient reports symptom onset approximately one month ago and has seen a doctor multiple times since. Today, they present with shortness of breath and wheezing. According to the patient, a chest X-ray performed last week showed no pneumonia. No known fever.  Note: History is limited due to the patient's current shortness of breath and wheezing.

## 2024-01-05 NOTE — Discharge Instructions (Signed)
  1. Postviral syndrome (Primary) - ipratropium-albuterol (DUONEB) 0.5-2.5 (3) MG/3ML nebulizer solution 3 mL complaining UC for acute wheezing and persistent cough - DG Chest 2 View x-ray completed in UC shows no acute cardiopulmonary processes, no sign of consolidation or pneumonia, normal chest x-ray. - predniSONE  (DELTASONE ) 20 MG tablet; Take 2 tablets (40 mg total) by mouth daily for 5 days.  Dispense: 10 tablet; Refill: 0 - promethazine-dextromethorphan (PROMETHAZINE-DM) 6.25-15 MG/5ML syrup; Take 10 mLs by mouth 3 (three) times daily as needed.  Dispense: 240 mL; Refill: 0 - azelastine (ASTELIN) 0.1 % nasal spray; Place 1 spray into both nostrils 2 (two) times daily. Use in each nostril as directed  Dispense: 30 mL; Refill: 1 -Continue to monitor symptoms for any change in severity if there is any escalation of current symptoms or development of new symptoms follow-up in ER for further evaluation and management.

## 2024-01-08 ENCOUNTER — Ambulatory Visit

## 2024-01-12 DIAGNOSIS — M4316 Spondylolisthesis, lumbar region: Secondary | ICD-10-CM | POA: Diagnosis not present

## 2024-01-12 DIAGNOSIS — M5416 Radiculopathy, lumbar region: Secondary | ICD-10-CM | POA: Diagnosis not present

## 2024-01-20 DIAGNOSIS — M5416 Radiculopathy, lumbar region: Secondary | ICD-10-CM | POA: Diagnosis not present

## 2024-01-20 DIAGNOSIS — M62830 Muscle spasm of back: Secondary | ICD-10-CM | POA: Diagnosis not present

## 2024-01-20 DIAGNOSIS — M546 Pain in thoracic spine: Secondary | ICD-10-CM | POA: Diagnosis not present

## 2024-01-20 DIAGNOSIS — M542 Cervicalgia: Secondary | ICD-10-CM | POA: Diagnosis not present

## 2024-01-22 ENCOUNTER — Ambulatory Visit (HOSPITAL_COMMUNITY)
Admission: RE | Admit: 2024-01-22 | Discharge: 2024-01-22 | Disposition: A | Discharge status: Home / Self Care | Source: Ambulatory Visit | Attending: Internal Medicine | Admitting: Internal Medicine

## 2024-01-22 DIAGNOSIS — I517 Cardiomegaly: Secondary | ICD-10-CM | POA: Insufficient documentation

## 2024-01-22 LAB — ECHOCARDIOGRAM COMPLETE
AR max vel: 2.97 cm2
AV Area VTI: 2.95 cm2
AV Area mean vel: 2.98 cm2
AV Mean grad: 8 mmHg
AV Peak grad: 14.9 mmHg
Ao pk vel: 1.93 m/s
Area-P 1/2: 4.63 cm2
Calc EF: 68.6 %
S' Lateral: 3.2 cm
Single Plane A2C EF: 66.3 %
Single Plane A4C EF: 70.8 %

## 2024-02-04 DIAGNOSIS — M5416 Radiculopathy, lumbar region: Secondary | ICD-10-CM | POA: Diagnosis not present
